# Patient Record
Sex: Female | Born: 1995 | Race: White | Hispanic: No | Marital: Married | Smoking: Never smoker
Health system: Southern US, Community
[De-identification: ages and names within clinical notes are randomized; demographics above are authoritative.]

## PROBLEM LIST (undated history)

## (undated) DIAGNOSIS — O139 Gestational [pregnancy-induced] hypertension without significant proteinuria, unspecified trimester: Secondary | ICD-10-CM

## (undated) DIAGNOSIS — Z8489 Family history of other specified conditions: Secondary | ICD-10-CM

## (undated) DIAGNOSIS — T8859XA Other complications of anesthesia, initial encounter: Secondary | ICD-10-CM

## (undated) DIAGNOSIS — T4145XA Adverse effect of unspecified anesthetic, initial encounter: Secondary | ICD-10-CM

## (undated) DIAGNOSIS — J353 Hypertrophy of tonsils with hypertrophy of adenoids: Secondary | ICD-10-CM

## (undated) HISTORY — PX: NO PAST SURGERIES: SHX2092

---
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
## 2013-01-12 ENCOUNTER — Telehealth: Payer: Self-pay | Admitting: Family Medicine

## 2013-01-12 NOTE — Telephone Encounter (Signed)
Discussed with patient. Offered patient office visit for evaluation but patient declined.

## 2013-01-12 NOTE — Telephone Encounter (Signed)
Patient seen 11/12/12 for illness

## 2013-01-12 NOTE — Telephone Encounter (Signed)
Patient has been having a headache, sore throat, sinus drainage, no facial pressure. She said since she has been in so many times this year for the same symptoms, she just wanted to see if we could call something in. I advised to her she may need an appointment.  [name]

## 2013-01-12 NOTE — Telephone Encounter (Signed)
1st- what she describes is a virus, give 7 days to run course, if gets worse ( svere facial pain or fever) then follow up , if virus goes beyond 7 to 10 days then more likely turning into a sinus infection and OV needed then

## 2013-01-13 ENCOUNTER — Ambulatory Visit: Payer: No Typology Code available for payment source | Admitting: Family Medicine

## 2013-03-20 ENCOUNTER — Ambulatory Visit (INDEPENDENT_AMBULATORY_CARE_PROVIDER_SITE_OTHER): Payer: BC Managed Care – PPO | Admitting: Adult Health

## 2013-03-20 ENCOUNTER — Encounter: Payer: Self-pay | Admitting: Adult Health

## 2013-03-20 ENCOUNTER — Encounter (INDEPENDENT_AMBULATORY_CARE_PROVIDER_SITE_OTHER): Payer: Self-pay

## 2013-03-20 VITALS — BP 108/80 | Ht 61.0 in | Wt 168.0 lb

## 2013-03-20 DIAGNOSIS — Z3202 Encounter for pregnancy test, result negative: Secondary | ICD-10-CM

## 2013-03-20 DIAGNOSIS — Z3049 Encounter for surveillance of other contraceptives: Secondary | ICD-10-CM

## 2013-03-20 DIAGNOSIS — Z3009 Encounter for other general counseling and advice on contraception: Secondary | ICD-10-CM | POA: Insufficient documentation

## 2013-03-20 LAB — POCT URINE PREGNANCY: Preg Test, Ur: NEGATIVE

## 2013-03-20 NOTE — Patient Instructions (Signed)
Etonogestrel implant What is this medicine? ETONOGESTREL (et oh noe JES trel) is a contraceptive (birth control) device. It is used to prevent pregnancy. It can be used for up to 3 years. This medicine may be used for other purposes; ask your health care provider or pharmacist if you have questions. COMMON BRAND NAME(S): Implanon, Nexplanon  What should I tell my health care provider before I take this medicine? They need to know if you have any of these conditions: -abnormal vaginal bleeding -blood vessel disease or blood clots -cancer of the breast, cervix, or liver -depression -diabetes -gallbladder disease -headaches -heart disease or recent heart attack -high blood pressure -high cholesterol -kidney disease -liver disease -renal disease -seizures -tobacco smoker -an unusual or allergic reaction to etonogestrel, other hormones, anesthetics or antiseptics, medicines, foods, dyes, or preservatives -pregnant or trying to get pregnant -breast-feeding How should I use this medicine? This device is inserted just under the skin on the inner side of your upper arm by a health care professional. Talk to your pediatrician regarding the use of this medicine in children. Special care may be needed. Overdosage: If you think you've taken too much of this medicine contact a poison control center or emergency room at once. Overdosage: If you think you have taken too much of this medicine contact a poison control center or emergency room at once. NOTE: This medicine is only for you. Do not share this medicine with others. What if I miss a dose? This does not apply. What may interact with this medicine? Do not take this medicine with any of the following medications: -amprenavir -bosentan -fosamprenavir This medicine may also interact with the following medications: -barbiturate medicines for inducing sleep or treating seizures -certain medicines for fungal infections like ketoconazole and  itraconazole -griseofulvin -medicines to treat seizures like carbamazepine, felbamate, oxcarbazepine, phenytoin, topiramate -modafinil -phenylbutazone -rifampin -some medicines to treat HIV infection like atazanavir, indinavir, lopinavir, nelfinavir, tipranavir, ritonavir -St. John's wort This list may not describe all possible interactions. Give your health care provider a list of all the medicines, herbs, non-prescription drugs, or dietary supplements you use. Also tell them if you smoke, drink alcohol, or use illegal drugs. Some items may interact with your medicine. What should I watch for while using this medicine? This product does not protect you against HIV infection (AIDS) or other sexually transmitted diseases. You should be able to feel the implant by pressing your fingertips over the skin where it was inserted. Tell your doctor if you cannot feel the implant. What side effects may I notice from receiving this medicine? Side effects that you should report to your doctor or health care professional as soon as possible: -allergic reactions like skin rash, itching or hives, swelling of the face, lips, or tongue -breast lumps -changes in vision -confusion, trouble speaking or understanding -dark urine -depressed mood -general ill feeling or flu-like symptoms -light-colored stools -loss of appetite, nausea -right upper belly pain -severe headaches -severe pain, swelling, or tenderness in the abdomen -shortness of breath, chest pain, swelling in a leg -signs of pregnancy -sudden numbness or weakness of the face, arm or leg -trouble walking, dizziness, loss of balance or coordination -unusual vaginal bleeding, discharge -unusually weak or tired -yellowing of the eyes or skin Side effects that usually do not require medical attention (Report these to your doctor or health care professional if they continue or are bothersome.): -acne -breast pain -changes in  weight -cough -fever or chills -headache -irregular menstrual bleeding -itching, burning,   and vaginal discharge -pain or difficulty passing urine -sore throat This list may not describe all possible side effects. Call your doctor for medical advice about side effects. You may report side effects to FDA at 1-800-FDA-1088. Where should I keep my medicine? This drug is given in a hospital or clinic and will not be stored at home. NOTE: This sheet is a summary. It may not cover all possible information. If you have questions about this medicine, talk to your doctor, pharmacist, or health care provider.  2014, Elsevier/Gold Standard. (2011-08-13 15:37:45) NO SEX Call with menses

## 2013-03-20 NOTE — Progress Notes (Signed)
Subjective:     Patient ID: Karen Suarez, female   DOB: May 12, 1995, 18 y.o.   MRN: [id number]  HPI Karen MastersCandace is a 18 year old white female who stopped taking her OCs in November, was forgetting to take and was having random bleeding, thinks she would like to try nexplanon.  Review of Systems See HPI Reviewed past medical,surgical, social and family history. Reviewed medications and allergies.     Objective:   Physical Exam BP 108/80  Ht 5\' 1"  (1.549 m)  Wt 168 lb (76.204 kg)  BMI 31.76 kg/m2  LMP 02/17/2013   UPT negative, last sex about 1 month ago. Discussed nexplanon with pt  And she wants to try it.  Assessment:     Contraceptive counselling    Plan:    Check insurance for nexplanon coverage Review handout on nexplanon No sex  Call with menses

## 2013-04-06 ENCOUNTER — Ambulatory Visit: Payer: BC Managed Care – PPO | Admitting: Nurse Practitioner

## 2013-04-13 ENCOUNTER — Ambulatory Visit (INDEPENDENT_AMBULATORY_CARE_PROVIDER_SITE_OTHER): Payer: BC Managed Care – PPO | Admitting: Women's Health

## 2013-04-13 ENCOUNTER — Other Ambulatory Visit: Payer: BC Managed Care – PPO

## 2013-04-13 ENCOUNTER — Encounter: Payer: Self-pay | Admitting: Women's Health

## 2013-04-13 VITALS — BP 112/82 | Ht 61.0 in | Wt 168.0 lb

## 2013-04-13 DIAGNOSIS — Z3202 Encounter for pregnancy test, result negative: Secondary | ICD-10-CM

## 2013-04-13 DIAGNOSIS — Z30017 Encounter for initial prescription of implantable subdermal contraceptive: Secondary | ICD-10-CM

## 2013-04-13 LAB — HCG, QUANTITATIVE, PREGNANCY: hCG, Beta Chain, Quant, S: <1 m[IU]/mL

## 2013-04-13 NOTE — Progress Notes (Signed)
Patient ID: Karen Suarez, female   DOB: [birth year], 18 y.o.   MRN: [id number] Karen Suarez is a 18 y.o. year old Caucasian G0P0 female here for Nexplanon insertion.  Patient's last menstrual period was 02/20/2013., last sexual intercourse was 1 month ago, and her pregnancy test today was negative.  Risks/benefits/side effects of Nexplanon have been discussed and her questions have been answered.  Specifically, a failure rate of 02/998 has been reported, with an increased failure rate if pt takes St. John's Wort and/or antiseizure medicaitons.  Karen Suarez is aware of the common side effect of irregular bleeding, which the incidence of decreases over time.  BP 112/82  Ht 5\' 1"  (1.549 m)  Wt 168 lb (76.204 kg)  BMI 31.76 kg/m2  LMP 02/20/2013  Results for orders placed in visit on 04/13/13 (from the past 24 hour(s))  HCG, QUANTITATIVE, PREGNANCY   Collection Time    04/13/13  9:25 AM      Result Value Ref Range   hCG, Beta Chain, Quant, S <1.0       She is right-handed, so her left arm, approximately 4 inches proximal from the elbow, was cleansed with alcohol and anesthetized with 2cc of 1% Lidocaine.  The area was cleansed again with betadine and the Nexplanon was inserted per manufacturer's recommendations without difficulty.  A steri-strip and pressure bandage were applied.  Pt was instructed to keep the area clean and dry, remove pressure bandage in 24 hours, and keep insertion site covered with the steri-strip for 3-5 days.  Back up contraception was recommended for 2 weeks.  She was given a card indicating date Nexplanon was inserted and date it needs to be removed. Follow-up PRN problems.  Marge DuncansBooker, Kimberly Randall CNM, [facility] CareWHNP-BC 04/13/2013 3:57 PM

## 2013-04-13 NOTE — Patient Instructions (Signed)
Keep the area clean and dry.  You can remove the big bandage in 24 hours, and the small steri-strip bandage in 3-5 days.  A back up method, such as condoms, should be used for two weeks. You may have irregular vaginal bleeding for the first 6 months after the Nexplanon is placed, then the bleeding usually lightens and it is possible that you may not have any periods.  If you have any concerns, please give us a call.    Etonogestrel implant-Nexplanon What is this medicine? ETONOGESTREL (et oh noe JES trel) is a contraceptive (birth control) device. It is used to prevent pregnancy. It can be used for up to 3 years. This medicine may be used for other purposes; ask your health care provider or pharmacist if you have questions. COMMON BRAND NAME(S): Implanon, Nexplanon  What should I tell my health care provider before I take this medicine? They need to know if you have any of these conditions: -abnormal vaginal bleeding -blood vessel disease or blood clots -cancer of the breast, cervix, or liver -depression -diabetes -gallbladder disease -headaches -heart disease or recent heart attack -high blood pressure -high cholesterol -kidney disease -liver disease -renal disease -seizures -tobacco smoker -an unusual or allergic reaction to etonogestrel, other hormones, anesthetics or antiseptics, medicines, foods, dyes, or preservatives -pregnant or trying to get pregnant -breast-feeding How should I use this medicine? This device is inserted just under the skin on the inner side of your upper arm by a health care professional. Talk to your pediatrician regarding the use of this medicine in children. Special care may be needed. Overdosage: If you think you've taken too much of this medicine contact a poison control center or emergency room at once. Overdosage: If you think you have taken too much of this medicine contact a poison control center or emergency room at once. NOTE: This medicine is  only for you. Do not share this medicine with others. What if I miss a dose? This does not apply. What may interact with this medicine? Do not take this medicine with any of the following medications: -amprenavir -bosentan -fosamprenavir This medicine may also interact with the following medications: -barbiturate medicines for inducing sleep or treating seizures -certain medicines for fungal infections like ketoconazole and itraconazole -griseofulvin -medicines to treat seizures like carbamazepine, felbamate, oxcarbazepine, phenytoin, topiramate -modafinil -phenylbutazone -rifampin -some medicines to treat HIV infection like atazanavir, indinavir, lopinavir, nelfinavir, tipranavir, ritonavir -St. John's wort This list may not describe all possible interactions. Give your health care provider a list of all the medicines, herbs, non-prescription drugs, or dietary supplements you use. Also tell them if you smoke, drink alcohol, or use illegal drugs. Some items may interact with your medicine. What should I watch for while using this medicine? This product does not protect you against HIV infection (AIDS) or other sexually transmitted diseases. You should be able to feel the implant by pressing your fingertips over the skin where it was inserted. Tell your doctor if you cannot feel the implant. What side effects may I notice from receiving this medicine? Side effects that you should report to your doctor or health care professional as soon as possible: -allergic reactions like skin rash, itching or hives, swelling of the face, lips, or tongue -breast lumps -changes in vision -confusion, trouble speaking or understanding -dark urine -depressed mood -general ill feeling or flu-like symptoms -light-colored stools -loss of appetite, nausea -right upper belly pain -severe headaches -severe pain, swelling, or tenderness in the abdomen -shortness   of breath, chest pain, swelling in a  leg -signs of pregnancy -sudden numbness or weakness of the face, arm or leg -trouble walking, dizziness, loss of balance or coordination -unusual vaginal bleeding, discharge -unusually weak or tired -yellowing of the eyes or skin Side effects that usually do not require medical attention (Report these to your doctor or health care professional if they continue or are bothersome.): -acne -breast pain -changes in weight -cough -fever or chills -headache -irregular menstrual bleeding -itching, burning, and vaginal discharge -pain or difficulty passing urine -sore throat This list may not describe all possible side effects. Call your doctor for medical advice about side effects. You may report side effects to FDA at 1-800-FDA-1088. Where should I keep my medicine? This drug is given in a hospital or clinic and will not be stored at home. NOTE: This sheet is a summary. It may not cover all possible information. If you have questions about this medicine, talk to your doctor, pharmacist, or health care provider.  2014, Elsevier/Gold Standard. (2011-08-13 15:37:45)  

## 2013-04-24 ENCOUNTER — Telehealth: Payer: Self-pay | Admitting: Family Medicine

## 2013-04-24 NOTE — Telephone Encounter (Signed)
Our policy is not to call in antibiotics unless someone has been seen recently for that problem. Does she need something for symptoms? Also, if not better into next week, please call for appt.

## 2013-04-24 NOTE — Telephone Encounter (Signed)
Patients mother and stepfather were seen last week for sinusitis ([place] and [place]). Patient is now having the same cough, congestion, and sinus issues and mom is hoping we can call something in for her.  Karen Suarez

## 2013-04-24 NOTE — Telephone Encounter (Signed)
Spoke with Karen Suarez, she verbalized understanding. She does not need anything else called in. I explained that we couldn't call in antibiotics w/o seeing the pt.

## 2013-08-19 ENCOUNTER — Encounter: Payer: Self-pay | Admitting: Nurse Practitioner

## 2013-08-19 ENCOUNTER — Ambulatory Visit (INDEPENDENT_AMBULATORY_CARE_PROVIDER_SITE_OTHER): Payer: BC Managed Care – PPO | Admitting: Nurse Practitioner

## 2013-08-19 VITALS — BP 104/68 | Temp 98.8°F | Ht 61.0 in | Wt 170.0 lb

## 2013-08-19 DIAGNOSIS — K2970 Gastritis, unspecified, without bleeding: Secondary | ICD-10-CM

## 2013-08-19 DIAGNOSIS — K2990 Gastroduodenitis, unspecified, without bleeding: Secondary | ICD-10-CM

## 2013-08-19 DIAGNOSIS — J329 Chronic sinusitis, unspecified: Secondary | ICD-10-CM

## 2013-08-19 DIAGNOSIS — K219 Gastro-esophageal reflux disease without esophagitis: Secondary | ICD-10-CM

## 2013-08-19 MED ORDER — OMEPRAZOLE 20 MG PO CPDR: 20.0000 mg | DELAYED_RELEASE_CAPSULE | Freq: Every day | ORAL | Status: DC

## 2013-08-19 MED ORDER — AMOXICILLIN-POT CLAVULANATE 875-125 MG PO TABS: 1.0000 | ORAL_TABLET | Freq: Two times a day (BID) | ORAL | Status: DC

## 2013-08-19 NOTE — Patient Instructions (Signed)
Nasacort AQ as directed Claritin or Allegra as directed  Gastroesophageal Reflux Disease, Adult Gastroesophageal reflux disease (GERD) happens when acid from your stomach flows up into the esophagus. When acid comes in contact with the esophagus, the acid causes soreness (inflammation) in the esophagus. Over time, GERD may create small holes (ulcers) in the lining of the esophagus. CAUSES   Increased body weight. This puts pressure on the stomach, making acid rise from the stomach into the esophagus.  Smoking. This increases acid production in the stomach.  Drinking alcohol. This causes decreased pressure in the lower esophageal sphincter (valve or ring of muscle between the esophagus and stomach), allowing acid from the stomach into the esophagus.  Late evening meals and a full stomach. This increases pressure and acid production in the stomach.  A malformed lower esophageal sphincter. Sometimes, no cause is found. SYMPTOMS   Burning pain in the lower part of the mid-chest behind the breastbone and in the mid-stomach area. This may occur twice a week or more often.  Trouble swallowing.  Sore throat.  Dry cough.  Asthma-like symptoms including chest tightness, shortness of breath, or wheezing. DIAGNOSIS  Your caregiver may be able to diagnose GERD based on your symptoms. In some cases, X-rays and other tests may be done to check for complications or to check the condition of your stomach and esophagus. TREATMENT  Your caregiver may recommend over-the-counter or prescription medicines to help decrease acid production. Ask your caregiver before starting or adding any new medicines.  HOME CARE INSTRUCTIONS   Change the factors that you can control. Ask your caregiver for guidance concerning weight loss, quitting smoking, and alcohol consumption.  Avoid foods and drinks that make your symptoms worse, such as:  Caffeine or alcoholic drinks.  Chocolate.  Peppermint or mint  flavorings.  Garlic and onions.  Spicy foods.  Citrus fruits, such as oranges, lemons, or limes.  Tomato-based foods such as sauce, chili, salsa, and pizza.  Fried and fatty foods.  Avoid lying down for the 3 hours prior to your bedtime or prior to taking a nap.  Eat small, frequent meals instead of large meals.  Wear loose-fitting clothing. Do not wear anything tight around your waist that causes pressure on your stomach.  Raise the head of your bed 6 to 8 inches with wood blocks to help you sleep. Extra pillows will not help.  Only take over-the-counter or prescription medicines for pain, discomfort, or fever as directed by your caregiver.  Do not take aspirin, ibuprofen, or other nonsteroidal anti-inflammatory drugs (NSAIDs). SEEK IMMEDIATE MEDICAL CARE IF:   You have pain in your arms, neck, jaw, teeth, or back.  Your pain increases or changes in intensity or duration.  You develop nausea, vomiting, or sweating (diaphoresis).  You develop shortness of breath, or you faint.  Your vomit is green, yellow, black, or looks like coffee grounds or blood.  Your stool is red, bloody, or black. These symptoms could be signs of other problems, such as heart disease, gastric bleeding, or esophageal bleeding. MAKE SURE YOU:   Understand these instructions.  Will watch your condition.  Will get help right away if you are not doing well or get worse. Document Released: 11/15/2004 Document Revised: 04/30/2011 Document Reviewed: 08/25/2010 [facility] Patient Information 2015 [place]. This information is not intended to replace advice given to you by your health care provider. Make sure you discuss any questions you have with your health care provider.

## 2013-08-20 ENCOUNTER — Encounter: Payer: Self-pay | Admitting: Family Medicine

## 2013-08-23 ENCOUNTER — Encounter: Payer: Self-pay | Admitting: Nurse Practitioner

## 2013-08-23 DIAGNOSIS — K219 Gastro-esophageal reflux disease without esophagitis: Secondary | ICD-10-CM | POA: Insufficient documentation

## 2013-08-23 DIAGNOSIS — K2970 Gastritis, unspecified, without bleeding: Secondary | ICD-10-CM | POA: Insufficient documentation

## 2013-08-23 NOTE — Progress Notes (Signed)
Subjective:  Presents complaints of daily sore throat over the past 2-3 months. No fever. 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 No wheezing. Headache only with prolonged cough. No ear pain. No choking. Off-and-on sensation of something in her throat. Has had burning in her throat for the past 24 hours. Has epigastric pain with acid reflux and heartburn at least 2-3 days out of the week. Drinks a large amount of caffeine. Nonsmoker. Denies alcohol or excessive NSAID use. Describes stress level as high. Some intake of acidic foods such as tomatoes. No constipation diarrhea or blood in her stools. Nausea but no vomiting. FMH: Her mother has GERD.  Objective:   BP 104/68  Temp(Src) 98.8 F (37.1 C) (Oral)  Ht 5\' 1"  (1.549 m)  Wt 170 lb (77.111 kg)  BMI 32.14 kg/m2 NAD. Alert, oriented. TMs normal limit. Pharynx injected with green PND noted. Neck supple with mild soft anterior adenopathy. Lungs clear. Heart regular rate rhythm. Thyroid no masses or tenderness noted. Abdomen soft nondistended with distinct epigastric area tenderness, otherwise benign.  Assessment: Rhinosinusitis  Gastritis  Gastroesophageal reflux disease without esophagitis  Plan:  Meds ordered this encounter  Medications  . amoxicillin-clavulanate (AUGMENTIN) 875-125 MG per tablet    Sig: Take 1 tablet by mouth 2 (two) times daily.    Dispense:  20 tablet    Refill:  0    Order Specific Question:  Supervising Provider    Answer:  Merlyn AlbertLUKING, WILLIAM S [[id number]]  . omeprazole (PRILOSEC) 20 MG capsule    Sig: Take 1 capsule (20 mg total) by mouth daily. Prn acid reflux    Dispense:  30 capsule    Refill:  5    Order Specific Question:  Supervising Provider    Answer:  Merlyn AlbertLUKING, WILLIAM S [[id number]]   Nasacort AQ and OTC antihistamine as directed. Given written and verbal information on reflux disease. Slowly cut back on  caffeine use. Discussed importance of stress reduction. Also feel that environmental irritants are contributing to her head congestion. Warning signs reviewed. Call back in 2-3 weeks if symptoms have not resolved, call or go to ED sooner if worse.

## 2013-12-31 ENCOUNTER — Encounter: Payer: Self-pay | Admitting: Family Medicine

## 2013-12-31 ENCOUNTER — Ambulatory Visit (INDEPENDENT_AMBULATORY_CARE_PROVIDER_SITE_OTHER): Payer: BC Managed Care – PPO | Admitting: Family Medicine

## 2013-12-31 VITALS — BP 128/82 | Temp 98.3°F | Ht 61.0 in | Wt 181.2 lb

## 2013-12-31 DIAGNOSIS — J329 Chronic sinusitis, unspecified: Secondary | ICD-10-CM

## 2013-12-31 MED ORDER — PANTOPRAZOLE SODIUM 40 MG PO TBEC: 40.0000 mg | DELAYED_RELEASE_TABLET | Freq: Every day | ORAL | Status: DC

## 2013-12-31 MED ORDER — CEFDINIR 300 MG PO CAPS: 300.0000 mg | ORAL_CAPSULE | Freq: Two times a day (BID) | ORAL | Status: DC

## 2013-12-31 NOTE — Progress Notes (Signed)
   Subjective:    Patient ID: Karen Suarez, female    DOB: [birth year], 18 y.o.   MRN: [id number]  Sinusitis This is a new problem. The current episode started in the past 7 days. Associated symptoms include congestion.    Sunday had runny nose and headac  Pressure and headache  Gunky ha,  Gets minimal allergies   No caffeince  Strong fam hx of reflux  Takes omep qm  Works at [employer]  Review of Systems  HENT: Positive for congestion.    No vomiting no diarrhea no rash    Objective:   Physical Exam Alert active good hydration. Moderate malaise. HEENT moderate nasal congestion frontal maxillary tenderness. Pharynx slight erythema. Lungs clear. Heart regular in rhythm.       Assessment & Plan:  Impression acute rhinosinusitis post viral plan antibiotics prescribed. Symptomatic care discussed. WSL

## 2014-02-10 ENCOUNTER — Encounter: Payer: Self-pay | Admitting: Family Medicine

## 2014-02-10 ENCOUNTER — Ambulatory Visit (INDEPENDENT_AMBULATORY_CARE_PROVIDER_SITE_OTHER): Payer: BC Managed Care – PPO | Admitting: Family Medicine

## 2014-02-10 VITALS — Temp 98.5°F | Ht 61.0 in | Wt 180.2 lb

## 2014-02-10 DIAGNOSIS — J0100 Acute maxillary sinusitis, unspecified: Secondary | ICD-10-CM

## 2014-02-10 MED ORDER — LEVOFLOXACIN 500 MG PO TABS: 500.0000 mg | ORAL_TABLET | Freq: Every day | ORAL | Status: DC

## 2014-02-10 NOTE — Progress Notes (Signed)
   Subjective:    Patient ID: Karen Suarez, female    DOB: 1996-02-10, 18 y.o.   MRN: [id number]  Sinusitis This is a new problem. The current episode started 1 to 4 weeks ago. Associated symptoms include congestion, coughing and headaches. Pertinent negatives include no ear pain or shortness of breath. Treatments tried: claritin d.   PMH benign   Review of Systems  Constitutional: Negative for fever and activity change.  HENT: Positive for congestion and rhinorrhea. Negative for ear pain.   Eyes: Negative for discharge.  Respiratory: Positive for cough. Negative for shortness of breath and wheezing.   Cardiovascular: Negative for chest pain.  Neurological: Positive for headaches.       Objective:   Physical Exam  Constitutional: She appears well-developed.  HENT:  Head: Normocephalic.  Nose: Nose normal.  Mouth/Throat: Oropharynx is clear and moist. No oropharyngeal exudate.  Neck: Neck supple.  Cardiovascular: Normal rate and normal heart sounds.   No murmur heard. Pulmonary/Chest: Effort normal and breath sounds normal. She has no wheezes.  Lymphadenopathy:    She has no cervical adenopathy.  Skin: Skin is warm and dry.  Nursing note and vitals reviewed.         Assessment & Plan:  Viral syndrome Secondary sinusitis Antibiotics prescribed

## 2014-07-26 ENCOUNTER — Encounter: Payer: Self-pay | Admitting: Family Medicine

## 2014-07-26 ENCOUNTER — Ambulatory Visit (INDEPENDENT_AMBULATORY_CARE_PROVIDER_SITE_OTHER): Payer: BLUE CROSS/BLUE SHIELD | Admitting: Family Medicine

## 2014-07-26 VITALS — BP 128/82 | Temp 98.5°F | Ht 61.0 in | Wt 182.2 lb

## 2014-07-26 DIAGNOSIS — J019 Acute sinusitis, unspecified: Secondary | ICD-10-CM | POA: Diagnosis not present

## 2014-07-26 DIAGNOSIS — J309 Allergic rhinitis, unspecified: Secondary | ICD-10-CM

## 2014-07-26 MED ORDER — AZELASTINE HCL 0.1 % NA SOLN: 2.0000 | Freq: Two times a day (BID) | NASAL | Status: DC

## 2014-07-26 MED ORDER — LORATADINE 10 MG PO TABS: 10.0000 mg | ORAL_TABLET | Freq: Every day | ORAL | Status: DC

## 2014-07-26 MED ORDER — AMOXICILLIN-POT CLAVULANATE 875-125 MG PO TABS: 1.0000 | ORAL_TABLET | Freq: Two times a day (BID) | ORAL | Status: AC

## 2014-07-26 NOTE — Progress Notes (Signed)
   Subjective:    Patient ID: Karen Suarez, female    DOB: 03-23-1995, 19 y.o.   MRN: [id number]  Sinus Problem This is a new problem. The current episode started more than 1 month ago. Associated symptoms include congestion, coughing and a sore throat.   Works around a bunch of hair products  Now wondering if weight has something to do  Constantly has to clear the throat   Only during the work week   For over a yr  Non smoker  No sig allergy hx  Pt constantly clearin g throat.  No sig otc meds     Review of Systems  HENT: Positive for congestion and sore throat.   Respiratory: Positive for cough.        Objective:   Physical Exam  Alert mild malaise moderate nasal congestion pharynx slight erythema neck supple. Lungs clear. Heart regular in rhythm.      Assessment & Plan:  Impression 1 acute rhinosinusitis #2 chronic allergic rhinitis secondary to environmental exposure at work plan patient notes sterilely nasal spray did not help. Patient also states Augmentin helped, but she is convinced most of her difficulty is chronic irritant exposure at work. Given Augmentin and Astelin. Encouraged to take loratadine daily

## 2014-07-29 ENCOUNTER — Ambulatory Visit (INDEPENDENT_AMBULATORY_CARE_PROVIDER_SITE_OTHER): Payer: BLUE CROSS/BLUE SHIELD | Admitting: Adult Health

## 2014-07-29 ENCOUNTER — Encounter: Payer: Self-pay | Admitting: Adult Health

## 2014-07-29 VITALS — BP 102/70 | HR 72 | Ht 61.0 in | Wt 185.0 lb

## 2014-07-29 DIAGNOSIS — Z975 Presence of (intrauterine) contraceptive device: Secondary | ICD-10-CM | POA: Diagnosis not present

## 2014-07-29 DIAGNOSIS — R635 Abnormal weight gain: Secondary | ICD-10-CM

## 2014-07-29 DIAGNOSIS — N912 Amenorrhea, unspecified: Secondary | ICD-10-CM | POA: Diagnosis not present

## 2014-07-29 NOTE — Progress Notes (Signed)
Subjective:     Patient ID: Karen Suarez, female   DOB: 18-Nov-1995, 19 y.o.   MRN: [id number]  HPI Karen Suarez is a 19 year old white female in complaining of weight gain on nexplanon and no period.Has had nexplanon since February 2015 and has gained about 17 lbs. She got the nexplanon to control her periods, did not remember to take pills.  Review of Systems  Patient denies any headaches, hearing loss, fatigue, blurred vision, shortness of breath, chest pain, abdominal pain, problems with bowel movements, urination, or intercourse(not having sex). No joint pain or mood swings.See HPI for positives.  Reviewed past medical,surgical, social and family history. Reviewed medications and allergies.     Objective:   Physical Exam BP 102/70 mmHg  Pulse 72  Ht 5\' 1"  (1.549 m)  Wt 185 lb (83.915 kg)  BMI 34.97 kg/m2 Skin warm and dry. Lungs: clear to ausculation bilaterally. Cardiovascular: regular rate and rhythm.    Discussed it is not unusual to not have period with nexplanon and try to lose weight, but if she wants it out, IUD is option where you don't have to think about it.But also discussed OCs, Depo, IUD and nuva ring.Will keep nexplanon for now.  Assessment:     Weight gain  Nexplanon in place Amenorrhea     Plan:    Return 9/26 for pap and physical Review handout on liletta and weight loss Work on losing weight

## 2014-07-29 NOTE — Patient Instructions (Signed)
Work on [employer] loss thru diet Return 9/26 for pap and physical Calorie Counting for Weight Loss Calories are energy you get from the things you eat and drink. Your body uses this energy to keep you going throughout the day. The number of calories you eat affects your weight. When you eat more calories than your body needs, your body stores the extra calories as fat. When you eat fewer calories than your body needs, your body burns fat to get the energy it needs. Calorie counting means keeping track of how many calories you eat and drink each day. If you make sure to eat fewer calories than your body needs, you should lose weight. In order for calorie counting to work, you will need to eat the number of calories that are right for you in a day to lose a healthy amount of weight per week. A healthy amount of weight to lose per week is usually 1-2 lb (0.5-0.9 kg). A dietitian can determine how many calories you need in a day and give you suggestions on how to reach your calorie goal.  WHAT IS MY MY PLAN? My goal is to have ________1500__ calories per day.  If I have this many calories per day, I should lose around ___1-2_______ pounds per week. WHAT DO I NEED TO KNOW ABOUT CALORIE COUNTING? In order to meet your daily calorie goal, you will need to:  Find out how many calories are in each food you would like to eat. Try to do this before you eat.  Decide how much of the food you can eat.  Write down what you ate and how many calories it had. Doing this is called keeping a food log. WHERE DO I FIND CALORIE INFORMATION? The number of calories in a food can be found on a Nutrition Facts label. Note that all the information on a label is based on a specific serving of the food. If a food does not have a Nutrition Facts label, try to look up the calories online or ask your dietitian for help. HOW DO I DECIDE HOW MUCH TO EAT? To decide how much of the food you can eat, you will need to consider both the  number of calories in one serving and the size of one serving. This information can be found on the Nutrition Facts label. If a food does not have a Nutrition Facts label, look up the information online or ask your dietitian for help. Remember that calories are listed per serving. If you choose to have more than one serving of a food, you will have to multiply the calories per serving by the amount of servings you plan to eat. For example, the label on a package of bread might say that a serving size is 1 slice and that there are 90 calories in a serving. If you eat 1 slice, you will have eaten 90 calories. If you eat 2 slices, you will have eaten 180 calories. HOW DO I KEEP A FOOD LOG? After each meal, record the following information in your food log:  What you ate.  How much of it you ate.  How many calories it had.  Then, add up your calories. Keep your food log near you, such as in a small notebook in your pocket. Another option is to use a mobile app or website. Some programs will calculate calories for you and show you how many calories you have left each time you add an item to the log.  WHAT ARE SOME CALORIE COUNTING TIPS?  Use your calories on foods and drinks that will fill you up and not leave you hungry. Some examples of this include foods like nuts and nut butters, vegetables, lean proteins, and high-fiber foods (more than 5 g fiber per serving).  Eat nutritious foods and avoid empty calories. Empty calories are calories you get from foods or beverages that do not have many nutrients, such as candy and soda. It is better to have a nutritious high-calorie food (such as an avocado) than a food with few nutrients (such as a bag of chips).  Know how many calories are in the foods you eat most often. This way, you do not have to look up how many calories they have each time you eat them.  Look out for foods that may seem like low-calorie foods but are really high-calorie foods, such as  baked goods, soda, and fat-free candy.  Pay attention to calories in drinks. Drinks such as sodas, specialty coffee drinks, alcohol, and juices have a lot of calories yet do not fill you up. Choose low-calorie drinks like water and diet drinks.  Focus your calorie counting efforts on higher calorie items. Logging the calories in a garden salad that contains only vegetables is less important than calculating the calories in a milk shake.  Find a way of tracking calories that works for you. Get creative. Most people who are successful find ways to keep track of how much they eat in a day, even if they do not count every calorie. WHAT ARE SOME PORTION CONTROL TIPS?  Know how many calories are in a serving. This will help you know how many servings of a certain food you can have.  Use a measuring cup to measure serving sizes. This is helpful when you start out. With time, you will be able to estimate serving sizes for some foods.  Take some time to put servings of different foods on your favorite plates, bowls, and cups so you know what a serving looks like.  Try not to eat straight from a bag or box. Doing this can lead to overeating. Put the amount you would like to eat in a cup or on a plate to make sure you are eating the right portion.  Use smaller plates, glasses, and bowls to prevent overeating. This is a quick and easy way to practice portion control. If your plate is smaller, less food can fit on it.  Try not to multitask while eating, such as watching TV or using your computer. If it is time to eat, sit down at a table and enjoy your food. Doing this will help you to start recognizing when you are full. It will also make you more aware of what and how much you are eating. HOW CAN I CALORIE COUNT WHEN EATING OUT?  Ask for smaller portion sizes or child-sized portions.  Consider sharing an entree and sides instead of getting your own entree.  If you get your own entree, eat only half.  Ask for a box at the beginning of your meal and put the rest of your entree in it so you are not tempted to eat it.  Look for the calories on the menu. If calories are listed, choose the lower calorie options.  Choose dishes that include vegetables, fruits, whole grains, low-fat dairy products, and lean protein. Focusing on smart food choices from each of the 5 food groups can help you stay on track at restaurants.  Choose items that are boiled, broiled, grilled, or steamed.  Choose water, milk, unsweetened iced tea, or other drinks without added sugars. If you want an alcoholic beverage, choose a lower calorie option. For example, a regular margarita can have up to 700 calories and a glass of wine has around 150.  Stay away from items that are buttered, battered, fried, or served with cream sauce. Items labeled "crispy" are usually fried, unless stated otherwise.  Ask for dressings, sauces, and syrups on the side. These are usually very high in calories, so do not eat much of them.  Watch out for salads. Many people think salads are a healthy option, but this is often not the case. Many salads come with bacon, fried chicken, lots of cheese, fried chips, and dressing. All of these items have a lot of calories. If you want a salad, choose a garden salad and ask for grilled meats or steak. Ask for the dressing on the side, or ask for olive oil and vinegar or lemon to use as dressing.  Estimate how many servings of a food you are given. For example, a serving of cooked rice is  cup or about the size of half a tennis ball or one cupcake wrapper. Knowing serving sizes will help you be aware of how much food you are eating at restaurants. The list below tells you how big or small some common portion sizes are based on everyday objects.  1 oz--4 stacked dice.  3 oz--1 deck of cards.  1 tsp--1 dice.  1 Tbsp-- a Ping-Pong ball.  2 Tbsp--1 Ping-Pong ball.   cup--1 tennis ball or 1 cupcake  wrapper.  1 cup--1 baseball. Document Released: 02/05/2005 Document Revised: 06/22/2013 Document Reviewed: 12/11/2012 [facility] Patient Information 2015 [place], [place]. This information is not intended to replace advice given to you by your health care provider. Make sure you discuss any questions you have with your health care provider.

## 2014-09-07 ENCOUNTER — Telehealth: Payer: Self-pay | Admitting: Adult Health

## 2014-09-07 NOTE — Telephone Encounter (Signed)
Spoke with pt. Pt states she wants her Nexplanon removed. Has been on period x 3 weeks. She is interested in starting pills. I advised she would need an appt. Call transferred to front desk for appt. JSY

## 2014-09-17 ENCOUNTER — Encounter: Payer: Self-pay | Admitting: Adult Health

## 2014-09-17 ENCOUNTER — Ambulatory Visit (INDEPENDENT_AMBULATORY_CARE_PROVIDER_SITE_OTHER): Payer: BLUE CROSS/BLUE SHIELD | Admitting: Adult Health

## 2014-09-17 VITALS — BP 118/60 | HR 88 | Ht 61.0 in | Wt 179.0 lb

## 2014-09-17 DIAGNOSIS — Z3202 Encounter for pregnancy test, result negative: Secondary | ICD-10-CM | POA: Diagnosis not present

## 2014-09-17 DIAGNOSIS — Z30011 Encounter for initial prescription of contraceptive pills: Secondary | ICD-10-CM

## 2014-09-17 DIAGNOSIS — Z3046 Encounter for surveillance of implantable subdermal contraceptive: Secondary | ICD-10-CM | POA: Insufficient documentation

## 2014-09-17 DIAGNOSIS — Z309 Encounter for contraceptive management, unspecified: Secondary | ICD-10-CM | POA: Insufficient documentation

## 2014-09-17 DIAGNOSIS — Z308 Encounter for other contraceptive management: Secondary | ICD-10-CM

## 2014-09-17 DIAGNOSIS — Z3200 Encounter for pregnancy test, result unknown: Secondary | ICD-10-CM

## 2014-09-17 LAB — POCT URINE PREGNANCY: PREG TEST UR: NEGATIVE

## 2014-09-17 MED ORDER — NORETHIN-ETH ESTRAD-FE BIPHAS 1 MG-10 MCG / 10 MCG PO TABS: 1.0000 | ORAL_TABLET | Freq: Every day | ORAL | Status: DC

## 2014-09-17 NOTE — Progress Notes (Signed)
Subjective:     Patient ID: Karen Suarez, female   DOB: September 10, 1995, 19 y.o.   MRN: [id number]  HPI Karen Suarez is a 19 year old white female in for nexplanon removal, she is moody and has irregular bleeding and wants it out.  Review of Systems Patient denies any headaches, hearing loss, fatigue, blurred vision, shortness of breath, chest pain, abdominal pain, problems with bowel movements, urination, or intercourse. No joint pain, see HPI for positives. Reviewed past medical,surgical, social and family history. Reviewed medications and allergies.      Objective:   Physical Exam BP 118/60 mmHg  Pulse 88  Ht  (1.549 m)  Wt 179 lb (81.194 kg)  BMI 33.84 kg/m2UPT negative, consent signed time out called, left arm cleansed with betadine, and injected with 1.5 cc 2% lidocaine and waited til numb.Under sterile technique a #11 blade was used to make small vertical incision, and a curved forceps was used to easily remove rod. Steri strips applied. Pressure dressing applied.She was a little hot and after resting was fine.    Assessment:     Nexplanon removal Contraceptive management    Plan:     Use condoms x 4 weeks, keep clean and dry x 24 hours, no heavy lifting, keep steri strips on x 72 hours, Keep pressure dressing on x 24 hours. Follow up prn problems.   Rx lo loestrin take 1 daily disp 1 pack with 11 refills, start today 1 pack given lot [id number] A exp 5/17 Return in September for pap as scheduled

## 2014-09-17 NOTE — Patient Instructions (Signed)
Take lo loestrin at same time daily, start today Use condoms Use condoms x 4 weeks, keep clean and dry x 24 hours, no heavy lifting, keep steri strips on x 72 hours, Keep pressure dressing on x 24 hours. Follow up prn problems. Return in September for pap

## 2014-10-13 ENCOUNTER — Telehealth: Payer: Self-pay | Admitting: Adult Health

## 2014-10-13 NOTE — Telephone Encounter (Signed)
Left message letting pt know [facility] pharmacy should have the prescription for pt's birth control. It was sent over in July with 11 refills. Advised to call [name] and if they don't have it, pt to call us back. JSY

## 2014-10-14 ENCOUNTER — Telehealth: Payer: Self-pay | Admitting: *Deleted

## 2014-10-14 NOTE — Telephone Encounter (Signed)
Left message x 1. JSY 

## 2014-10-15 NOTE — Telephone Encounter (Signed)
Lo Loestrin samples given to pt. 2 boxes. Lot # [id number] A exp 6/17. JSY

## 2014-11-15 ENCOUNTER — Ambulatory Visit (INDEPENDENT_AMBULATORY_CARE_PROVIDER_SITE_OTHER): Payer: BLUE CROSS/BLUE SHIELD | Admitting: Adult Health

## 2014-11-15 ENCOUNTER — Other Ambulatory Visit (HOSPITAL_COMMUNITY)
Admission: RE | Admit: 2014-11-15 | Discharge: 2014-11-15 | Disposition: A | Discharge status: Home / Self Care | Payer: BLUE CROSS/BLUE SHIELD | Source: Ambulatory Visit | Attending: Adult Health | Admitting: Adult Health

## 2014-11-15 ENCOUNTER — Encounter: Payer: Self-pay | Admitting: Adult Health

## 2014-11-15 VITALS — BP 122/88 | HR 84 | Ht 61.0 in | Wt 181.0 lb

## 2014-11-15 DIAGNOSIS — Z01419 Encounter for gynecological examination (general) (routine) without abnormal findings: Secondary | ICD-10-CM

## 2014-11-15 DIAGNOSIS — Z113 Encounter for screening for infections with a predominantly sexual mode of transmission: Secondary | ICD-10-CM | POA: Insufficient documentation

## 2014-11-15 DIAGNOSIS — Z3041 Encounter for surveillance of contraceptive pills: Secondary | ICD-10-CM

## 2014-11-15 NOTE — Progress Notes (Signed)
Patient ID: Karen Suarez, female   DOB: [birth year], 19 y.o.   MRN: [id number] History of Present Illness: Karen Suarez is a 19 year old white female in for a well woman gyn exam and pap.   Current Medications, Allergies, Past Medical History, Past Surgical History, Family History and Social History were reviewed in [employer] record.     Review of Systems: Patient denies any headaches, hearing loss, fatigue, blurred vision, shortness of breath, chest pain, abdominal pain, problems with bowel movements, urination, or intercourse(not currently having sex,last time was uncomfortable). No joint pain or mood swings.She is happy with her OCs.    Physical Exam:BP 122/88 mmHg  Pulse 84  Ht  (1.549 m)  Wt 181 lb (82.101 kg)  BMI 34.22 kg/m2  LMP 09/27/2014 (Approximate) General:  Well developed, well nourished, no acute distress Skin:  Warm and dry Neck:  Midline trachea, normal thyroid, good ROM, no lymphadenopathy Lungs; Clear to auscultation bilaterally Breast:  No dominant palpable mass, retraction, or nipple discharge Cardiovascular: Regular rate and rhythm Abdomen:  Soft, non tender, no hepatosplenomegaly Pelvic:  External genitalia is normal in appearance, no lesions.  The vagina is normal in appearance,has white discharge without odor. Urethra has no lesions or masses. The cervix is smooth, pap with GC/CHL performed.  Uterus is felt to be normal size, shape, and contour.  No adnexal masses or tenderness noted.Bladder is non tender, no masses felt. Extremities/musculoskeletal:  No swelling or varicosities noted, no clubbing or cyanosis Psych:  No mood changes, alert and cooperative,seems happy   Impression: Well woman gyn exam and pap Contraceptive maintenance     Plan: Continue lo loestrin, has refills Physical in 1 year, pap in 2 years if normal

## 2014-11-15 NOTE — Addendum Note (Signed)
Addended by: Colen Darling on: 11/15/2014 09:20 AM   Modules accepted: Orders

## 2014-11-15 NOTE — Patient Instructions (Signed)
Physical in 1 year Continue OCs  

## 2014-11-16 ENCOUNTER — Telehealth: Payer: Self-pay | Admitting: Adult Health

## 2014-11-16 LAB — CYTOLOGY - PAP

## 2014-11-16 MED ORDER — FLUCONAZOLE 150 MG PO TABS
ORAL_TABLET | ORAL | Status: DC
Start: 2014-11-16 — End: 2015-01-24

## 2014-11-16 NOTE — Telephone Encounter (Signed)
Left message pap was normal but has yeast, will rx diflucan

## 2014-12-01 ENCOUNTER — Telehealth: Payer: Self-pay | Admitting: Family Medicine

## 2014-12-01 DIAGNOSIS — J312 Chronic pharyngitis: Secondary | ICD-10-CM

## 2014-12-01 NOTE — Telephone Encounter (Signed)
You can also reach her at [phone]

## 2014-12-01 NOTE — Telephone Encounter (Signed)
Pt is needing a referral to an ent for recurrent sore throats.

## 2014-12-01 NOTE — Telephone Encounter (Signed)
Discussed with pt. Order for referral put in.  

## 2014-12-01 NOTE — Telephone Encounter (Signed)
May go ahead with referral.Dr.Teoh-or ENT of her choice

## 2014-12-03 ENCOUNTER — Telehealth: Payer: Self-pay | Admitting: Family Medicine

## 2014-12-03 NOTE — Telephone Encounter (Signed)
Pt states that she was wondering when her referral to the ENT was going  To happen? Advised it is in the system an we are working on them as  Fast as we can, that Karen Suarez will be calling her or the ENT when the process Has made it way through.   Wanted you to know she was checking on the an would like it sooner rather than later

## 2015-01-10 ENCOUNTER — Other Ambulatory Visit: Payer: Self-pay | Admitting: Otolaryngology

## 2015-01-20 DIAGNOSIS — J353 Hypertrophy of tonsils with hypertrophy of adenoids: Secondary | ICD-10-CM

## 2015-01-20 HISTORY — DX: Hypertrophy of tonsils with hypertrophy of adenoids: J35.3

## 2015-01-21 ENCOUNTER — Telehealth: Payer: Self-pay | Admitting: Adult Health

## 2015-01-21 MED ORDER — NORETHINDRONE ACET-ETHINYL EST 1-20 MG-MCG PO TABS: 1.0000 | ORAL_TABLET | Freq: Every day | ORAL | Status: DC

## 2015-01-21 NOTE — Telephone Encounter (Signed)
Spoke with pt. Pt is on Lo Loestrin but needs something cheaper, under $15.00. She would like something similar to Lo Loestrin. Please advise. Thanks!! JSY

## 2015-01-21 NOTE — Telephone Encounter (Signed)
Will rx junel 1-20 °

## 2015-01-24 ENCOUNTER — Encounter (HOSPITAL_BASED_OUTPATIENT_CLINIC_OR_DEPARTMENT_OTHER): Payer: Self-pay | Admitting: *Deleted

## 2015-01-31 ENCOUNTER — Encounter (HOSPITAL_BASED_OUTPATIENT_CLINIC_OR_DEPARTMENT_OTHER)
Admission: RE | Disposition: A | Discharge status: Home / Self Care | Payer: Self-pay | Source: Ambulatory Visit | Attending: Otolaryngology

## 2015-01-31 ENCOUNTER — Ambulatory Visit (HOSPITAL_BASED_OUTPATIENT_CLINIC_OR_DEPARTMENT_OTHER): Payer: BLUE CROSS/BLUE SHIELD | Admitting: Anesthesiology

## 2015-01-31 ENCOUNTER — Ambulatory Visit (HOSPITAL_BASED_OUTPATIENT_CLINIC_OR_DEPARTMENT_OTHER)
Admission: RE | Admit: 2015-01-31 | Discharge: 2015-01-31 | Disposition: A | Discharge status: Home / Self Care | Payer: BLUE CROSS/BLUE SHIELD | Source: Ambulatory Visit | Attending: Otolaryngology | Admitting: Otolaryngology

## 2015-01-31 ENCOUNTER — Encounter (HOSPITAL_BASED_OUTPATIENT_CLINIC_OR_DEPARTMENT_OTHER): Payer: Self-pay

## 2015-01-31 DIAGNOSIS — J3501 Chronic tonsillitis: Secondary | ICD-10-CM | POA: Insufficient documentation

## 2015-01-31 DIAGNOSIS — J353 Hypertrophy of tonsils with hypertrophy of adenoids: Secondary | ICD-10-CM | POA: Insufficient documentation

## 2015-01-31 DIAGNOSIS — R599 Enlarged lymph nodes, unspecified: Secondary | ICD-10-CM | POA: Insufficient documentation

## 2015-01-31 DIAGNOSIS — J312 Chronic pharyngitis: Secondary | ICD-10-CM | POA: Insufficient documentation

## 2015-01-31 HISTORY — PX: TONSILLECTOMY AND ADENOIDECTOMY: SHX28

## 2015-01-31 HISTORY — DX: Hypertrophy of tonsils with hypertrophy of adenoids: J35.3

## 2015-01-31 HISTORY — DX: Family history of other specified conditions: Z84.89

## 2015-01-31 SURGERY — TONSILLECTOMY AND ADENOIDECTOMY
Anesthesia: General | Site: Throat

## 2015-01-31 MED ORDER — HYDROMORPHONE HCL 1 MG/ML IJ SOLN
INTRAMUSCULAR | Status: AC
  Filled 2015-01-31: qty 1

## 2015-01-31 MED ORDER — PROMETHAZINE HCL 25 MG/ML IJ SOLN
6.2500 mg | INTRAMUSCULAR | Status: DC | PRN
  Administered 2015-01-31: 6.25 mg via INTRAVENOUS

## 2015-01-31 MED ORDER — SUCCINYLCHOLINE CHLORIDE 20 MG/ML IJ SOLN
INTRAMUSCULAR | Status: DC | PRN
  Administered 2015-01-31: 100 mg via INTRAVENOUS

## 2015-01-31 MED ORDER — MIDAZOLAM HCL 2 MG/2ML IJ SOLN
INTRAMUSCULAR | Status: AC
  Filled 2015-01-31: qty 2

## 2015-01-31 MED ORDER — LIDOCAINE HCL (CARDIAC) 20 MG/ML IV SOLN
INTRAVENOUS | Status: AC
  Filled 2015-01-31: qty 5

## 2015-01-31 MED ORDER — LIDOCAINE HCL (CARDIAC) 20 MG/ML IV SOLN
INTRAVENOUS | Status: DC | PRN
  Administered 2015-01-31: 50 mg via INTRAVENOUS

## 2015-01-31 MED ORDER — PROMETHAZINE HCL 25 MG/ML IJ SOLN
INTRAMUSCULAR | Status: AC
  Filled 2015-01-31: qty 1

## 2015-01-31 MED ORDER — MIDAZOLAM HCL 5 MG/5ML IJ SOLN
INTRAMUSCULAR | Status: DC | PRN
  Administered 2015-01-31: 2 mg via INTRAVENOUS

## 2015-01-31 MED ORDER — OXYCODONE HCL 5 MG/5ML PO SOLN: 5.0000 mg | ORAL | Status: DC | PRN

## 2015-01-31 MED ORDER — BACITRACIN ZINC 500 UNIT/GM EX OINT
TOPICAL_OINTMENT | CUTANEOUS | Status: AC
  Filled 2015-01-31: qty 0.9

## 2015-01-31 MED ORDER — FENTANYL CITRATE (PF) 100 MCG/2ML IJ SOLN
INTRAMUSCULAR | Status: DC | PRN
  Administered 2015-01-31: 100 ug via INTRAVENOUS

## 2015-01-31 MED ORDER — OXYMETAZOLINE HCL 0.05 % NA SOLN
NASAL | Status: DC | PRN
  Administered 2015-01-31: 1 via TOPICAL

## 2015-01-31 MED ORDER — ONDANSETRON HCL 4 MG/2ML IJ SOLN
INTRAMUSCULAR | Status: DC | PRN
  Administered 2015-01-31: 4 mg via INTRAVENOUS

## 2015-01-31 MED ORDER — DEXAMETHASONE SODIUM PHOSPHATE 10 MG/ML IJ SOLN
INTRAMUSCULAR | Status: AC
  Filled 2015-01-31: qty 1

## 2015-01-31 MED ORDER — BACITRACIN 500 UNIT/GM EX OINT
TOPICAL_OINTMENT | CUTANEOUS | Status: DC | PRN
  Administered 2015-01-31: 1 via TOPICAL

## 2015-01-31 MED ORDER — SODIUM CHLORIDE 0.9 % IR SOLN
Status: DC | PRN
  Administered 2015-01-31: 1

## 2015-01-31 MED ORDER — AMOXICILLIN 400 MG/5ML PO SUSR: 800.0000 mg | Freq: Two times a day (BID) | ORAL | Status: AC

## 2015-01-31 MED ORDER — PROPOFOL 500 MG/50ML IV EMUL
INTRAVENOUS | Status: AC
  Filled 2015-01-31: qty 50

## 2015-01-31 MED ORDER — DEXAMETHASONE SODIUM PHOSPHATE 4 MG/ML IJ SOLN
INTRAMUSCULAR | Status: DC | PRN
  Administered 2015-01-31: 10 mg via INTRAVENOUS

## 2015-01-31 MED ORDER — FENTANYL CITRATE (PF) 100 MCG/2ML IJ SOLN
INTRAMUSCULAR | Status: AC
  Filled 2015-01-31: qty 2

## 2015-01-31 MED ORDER — SUCCINYLCHOLINE CHLORIDE 20 MG/ML IJ SOLN
INTRAMUSCULAR | Status: AC
  Filled 2015-01-31: qty 1

## 2015-01-31 MED ORDER — LACTATED RINGERS IV SOLN
INTRAVENOUS | Status: DC
  Administered 2015-01-31 (×2): via INTRAVENOUS

## 2015-01-31 MED ORDER — ONDANSETRON HCL 4 MG/2ML IJ SOLN
INTRAMUSCULAR | Status: AC
  Filled 2015-01-31: qty 2

## 2015-01-31 MED ORDER — PROPOFOL 10 MG/ML IV BOLUS
INTRAVENOUS | Status: DC | PRN
  Administered 2015-01-31: 200 mg via INTRAVENOUS

## 2015-01-31 MED ORDER — OXYMETAZOLINE HCL 0.05 % NA SOLN
NASAL | Status: AC
  Filled 2015-01-31: qty 15

## 2015-01-31 MED ORDER — HYDROMORPHONE HCL 1 MG/ML IJ SOLN
0.2500 mg | INTRAMUSCULAR | Status: DC | PRN
  Administered 2015-01-31 (×3): 0.25 mg via INTRAVENOUS
  Administered 2015-01-31: 0.5 mg via INTRAVENOUS
  Administered 2015-01-31: 0.25 mg via INTRAVENOUS

## 2015-01-31 SURGICAL SUPPLY — 31 items
BANDAGE COBAN STERILE 2 (GAUZE/BANDAGES/DRESSINGS) IMPLANT
CANISTER SUCT 1200ML W/VALVE (MISCELLANEOUS) ×3 IMPLANT
CATH ROBINSON RED A/P 10FR (CATHETERS) IMPLANT
CATH ROBINSON RED A/P 14FR (CATHETERS) ×3 IMPLANT
COAGULATOR SUCT 6 FR SWTCH (ELECTROSURGICAL)
COAGULATOR SUCT SWTCH 10FR 6 (ELECTROSURGICAL) IMPLANT
COVER MAYO STAND STRL (DRAPES) ×3 IMPLANT
ELECT REM PT RETURN 9FT ADLT (ELECTROSURGICAL) ×3
ELECT REM PT RETURN 9FT PED (ELECTROSURGICAL)
ELECTRODE REM PT RETRN 9FT PED (ELECTROSURGICAL) IMPLANT
ELECTRODE REM PT RTRN 9FT ADLT (ELECTROSURGICAL) ×1 IMPLANT
GLOVE BIO SURGEON STRL SZ7.5 (GLOVE) ×3 IMPLANT
GLOVE SURG SS PI 7.0 STRL IVOR (GLOVE) ×6 IMPLANT
GOWN STRL REUS W/ TWL LRG LVL3 (GOWN DISPOSABLE) ×2 IMPLANT
GOWN STRL REUS W/TWL LRG LVL3 (GOWN DISPOSABLE) ×4
IV NS 500ML (IV SOLUTION) ×2
IV NS 500ML BAXH (IV SOLUTION) ×1 IMPLANT
MARKER SKIN DUAL TIP RULER LAB (MISCELLANEOUS) IMPLANT
NS IRRIG 1000ML POUR BTL (IV SOLUTION) ×3 IMPLANT
SHEET MEDIUM DRAPE 40X70 STRL (DRAPES) ×3 IMPLANT
SOLUTION BUTLER CLEAR DIP (MISCELLANEOUS) ×3 IMPLANT
SPONGE GAUZE 4X4 12PLY STER LF (GAUZE/BANDAGES/DRESSINGS) ×3 IMPLANT
SPONGE TONSIL 1 RF SGL (DISPOSABLE) IMPLANT
SPONGE TONSIL 1.25 RF SGL STRG (GAUZE/BANDAGES/DRESSINGS) ×3 IMPLANT
SYR BULB 3OZ (MISCELLANEOUS) IMPLANT
TOWEL OR 17X24 6PK STRL BLUE (TOWEL DISPOSABLE) ×3 IMPLANT
TUBE CONNECTING 20'X1/4 (TUBING) ×1
TUBE CONNECTING 20X1/4 (TUBING) ×2 IMPLANT
TUBE SALEM SUMP 12R W/ARV (TUBING) IMPLANT
TUBE SALEM SUMP 16 FR W/ARV (TUBING) ×3 IMPLANT
WAND COBLATOR 70 EVAC XTRA (SURGICAL WAND) ×3 IMPLANT

## 2015-01-31 NOTE — Anesthesia Procedure Notes (Signed)
Procedure Name: Intubation Date/Time: 01/31/2015 9:37 AM Performed by: Caren MacadamARTER, Mccall Lomax W Pre-anesthesia Checklist: Patient identified, Emergency Drugs available, Suction available and Patient being monitored Patient Re-evaluated:Patient Re-evaluated prior to inductionOxygen Delivery Method: Circle System Utilized Preoxygenation: Pre-oxygenation with 100% oxygen Intubation Type: IV induction Ventilation: Mask ventilation without difficulty Laryngoscope Size: Miller and 2 Grade View: Grade I Tube type: Oral Tube size: 7.0 mm Number of attempts: 1 Airway Equipment and Method: Stylet and Oral airway Placement Confirmation: ETT inserted through vocal cords under direct vision,  positive ETCO2 and breath sounds checked- equal and bilateral Secured at: 22 cm Tube secured with: Tape Dental Injury: Teeth and Oropharynx as per pre-operative assessment

## 2015-01-31 NOTE — Discharge Instructions (Addendum)
[facility] °Postoperative Instructions for Tonsillectomy & Adenoidectomy (T&A) °Activity °Restrict activity at home for the first two days, resting as much as possible. Light indoor activity is best. You may usually return to school or work within a week but void strenuous activity and sports for two weeks. Sleep with your head elevated on 2-3 pillows for 3-4 days to help decrease swelling. °Diet °Due to tissue swelling and throat discomfort, you may have little desire to drink for several days. However fluids are very important to prevent dehydration. You will find that non-acidic juices, soups, popsicles, Jell-O, custard, puddings, and any soft or mashed foods taken in small quantities can be swallowed fairly easily. Try to increase your fluid and food intake as the discomfort subsides. It is recommended that a child receive 1-1/2 quarts of fluid in a 24-hour period. Adult require twice this amount.  °Discomfort °Your sore throat may be relieved by applying an ice collar to your neck and/or by taking Tylenol®. You may experience an earache, which is due to referred pain from the throat. Referred ear pain is commonly felt at night when trying to rest. ° °Bleeding                        Although rare, there is risk of having some bleeding during the first 2 weeks after having a T&A. This usually happens between days 7-10 postoperatively. If you or your child should have any bleeding, try to remain calm. We recommend sitting up quietly in a chair and gently spitting out the blood into a bowl. For adults, gargling gently with ice water may help. If the bleeding does not stop after a short time (5 minutes), is more than 1 teaspoonful, or if you become worried, please call our office at [phone] or go directly to the nearest hospital emergency room. Do not eat or drink anything prior to going to the hospital as you may need to be taken to the operating room in order to control the bleeding. °GENERAL  CONSIDERATIONS °1. Brush your teeth regularly. Avoid mouthwashes and gargles for three weeks. You may gargle gently with warm salt-water as necessary or spray with Chloraseptic®. You may make salt-water by placing 2 teaspoons of table salt into a quart of fresh water. Warm the salt-water in a microwave to a luke warm temperature.  °2. Avoid exposure to colds and upper respiratory infections if possible.  °3. If you look into a mirror or into your child's mouth, you will see white-gray patches in the back of the throat. This is normal after having a T&A and is like a scab that forms on the skin after an abrasion. It will disappear once the back of the throat heals completely. However, it may cause a noticeable odor; this too will disappear with time. Again, warm salt-water gargles may be used to help keep the throat clean and promote healing.  °4. You may notice a temporary change in voice quality, such as a higher pitched voice or a nasal sound, until healing is complete. This may last for 1-2 weeks and should resolve.  °5. Do not take or give you child any medications that we have not prescribed or recommended.  °6. Snoring may occur, especially at night, for the first week after a T&A. It is due to swelling of the soft palate and will usually resolve.  °Please call our office at [phone] if you have any questions.   ° ° ° °  Post Anesthesia Home Care Instructions ° °Activity: °Get plenty of rest for the remainder of the day. A responsible adult should stay with you for 24 hours following the procedure.  °For the next 24 hours, DO NOT: °-Drive a car °-Operate machinery °-Drink alcoholic beverages °-Take any medication unless instructed by your physician °-Make any legal decisions or sign important papers. ° °Meals: °Start with liquid foods such as gelatin or soup. Progress to regular foods as tolerated. Avoid greasy, spicy, heavy foods. If nausea and/or vomiting occur, drink only clear liquids until the nausea  and/or vomiting subsides. Call your physician if vomiting continues. ° °Special Instructions/Symptoms: °Your throat may feel dry or sore from the anesthesia or the breathing tube placed in your throat during surgery. If this causes discomfort, gargle with warm salt water. The discomfort should disappear within 24 hours. ° °If you had a scopolamine patch placed behind your ear for the management of post- operative nausea and/or vomiting: ° °1. The medication in the patch is effective for 72 hours, after which it should be removed.  Wrap patch in a tissue and discard in the trash. Wash hands thoroughly with soap and water. °2. You may remove the patch earlier than 72 hours if you experience unpleasant side effects which may include dry mouth, dizziness or visual disturbances. °3. Avoid touching the patch. Wash your hands with soap and water after contact with the patch. °  ° °

## 2015-01-31 NOTE — Op Note (Signed)
DATE OF PROCEDURE:  01/31/2015                              OPERATIVE REPORT  SURGEON:  Newman PiesSu Kendrell Lottman, MD  PREOPERATIVE DIAGNOSES: 1. Adenotonsillar hypertrophy. 2. Chronic tonsillitis and pharyngitis  POSTOPERATIVE DIAGNOSES: 1. Adenotonsillar hypertrophy. 2. Chronic tonsillitis and pharyngitis  PROCEDURE PERFORMED:  Adenotonsillectomy.  ANESTHESIA:  General endotracheal tube anesthesia.  COMPLICATIONS:  None.  ESTIMATED BLOOD LOSS:  Minimal.  INDICATION FOR PROCEDURE:  Karen Suarez is a 19 y.o. female with a history of chronic tonsillitis/pharyngitis and halitosis.  According to the patient, she has been experiencing chronic throat discomfort with halitosis for several years. The patient continues to be symptomatic despite medical treatments. On examination, the patient was noted to have bilateral cryptic tonsils, with numerous tonsilloliths. Based on the above findings, the decision was made for the patient to undergo the adenotonsillectomy procedure. Likelihood of success in reducing symptoms was also discussed.  The risks, benefits, alternatives, and details of the procedure were discussed with the mother.  Questions were invited and answered.  Informed consent was obtained.  DESCRIPTION:  The patient was taken to the operating room and placed supine on the operating table.  General endotracheal tube anesthesia was administered by the anesthesiologist.  The patient was positioned and prepped and draped in a standard fashion for adenotonsillectomy.  A Crowe-Davis mouth gag was inserted into the oral cavity for exposure. 2+ cryptic tonsils were noted bilaterally.  No bifidity was noted.  Indirect mirror examination of the nasopharynx revealed mild adenoid hypertrophy. The adenoid was ablated with the Coblator device. Hemostasis was achieved with the Coblator device.  The right tonsil was then grasped with a straight Allis clamp and retracted medially.  It was resected free from the underlying  pharyngeal constrictor muscles with the Coblator device.  The same procedure was repeated on the left side without exception.  The surgical sites were copiously irrigated.  The mouth gag was removed.  The care of the patient was turned over to the anesthesiologist.  The patient was awakened from anesthesia without difficulty.  The patient was extubated and transferred to the recovery room in good condition.  OPERATIVE FINDINGS:  Adenotonsillar hypertrophy.  SPECIMEN:  Bilateral tonsils  FOLLOWUP CARE:  The patient will be discharged home once awake and alert.  She will be placed on amoxicillin 800 mg p.o. b.i.d. for 5 days, and oxycodone 5-1110ml po q 4 hours for postop pain control.   The patient will follow up in my office in approximately 2 weeks.  Ronold Hardgrove,SUI W 01/31/2015 10:09 AM

## 2015-01-31 NOTE — Transfer of Care (Signed)
Immediate Anesthesia Transfer of Care Note  Patient: Kendyll P Ward  Procedure(s) Performed: Procedure(s): TONSILLECTOMY AND ADENOIDECTOMY (N/A)  Patient Location: PACU  Anesthesia Type:General  Level of Consciousness: awake  Airway & Oxygen Therapy: Patient Spontanous Breathing and Patient connected to face mask oxygen  Post-op Assessment: Report given to RN and Post -op Vital signs reviewed and stable  Post vital signs: Reviewed and stable  Last Vitals:  Filed Vitals:   01/31/15 0812  BP: 121/65  Pulse: 85  Temp: 36.7 C  Resp: 20    Complications: No apparent anesthesia complications

## 2015-01-31 NOTE — H&P (Signed)
Cc: Chronic sore throat, halitosis  HPI: The patient is a 19 y/o female who presents today with complaints of recurrent sore throat and frequent halitosis. The patient is [facility] in [facility]. The patient has noted frequent sore throat for the past year. She also complains of bothersome tonsil stones which cause discomfort and halitosis. The patient denies any significant history of snoring. She is otherwise healthy. No previous ENT surgery is noted.   The patient's review of systems (constitutional, eyes, ENT, cardiovascular, respiratory, GI, musculoskeletal, skin, neurologic, psychiatric, endocrine, hematologic, allergic) is noted in the ROS questionnaire.  It is reviewed with the patient.   Family health history: Diabetes, Problems with anesthesia.   Major events: None.   Ongoing medical problems: Headaches, nausea.   Social history: The patient is single. She denies the use of alcohol, tobacco or illegal drugs..   Exam General: Communicates without difficulty, well nourished, no acute distress. Head: Normocephalic, no evidence injury, no tenderness, facial buttresses intact without stepoff. Eyes: PERRL, EOMI.  No scleral icterus, conjunctivae clear. Ears: External auditory canals clear bilaterally.  There is no edema or erythema.  Tympanic membrane is within normal limits bilaterally. Nose: Normal skin and external support.  Anterior rhinoscopy reveals healthy pink mucosa over the septum and turbinates.  No lesions or polyps were seen. Oral cavity: Lips without lesions, oral mucosa moist, no masses or lesions seen. Tonsils 1+, cryptic. Pharynx: Clear, no erythema. Neck: Supple, full range of motion, no lymphadenopathy, no masses palpable. Salivary: Parotid and submandibular glands without mass. Neuro:  CN 2-12 grossly intact. Gait normal. Vestibular: No nystagmus at any point of gaze.   Assessment 1.  The patient's history and physical exam findings are  consistent with chronic tonsillitis/pharyngitis and recurrent tonsilloliths resulting in halitosis. Tonsils are 1+ and cryptic.    Plan 1. The treatment options include continuing conservative observation, irrigation with the use of a water pik, or adenotonsillectomy.  Based on the patient's history and physical exam findings, the patient will likely benefit from having the tonsils and adenoid removed.  The risks, benefits, alternatives, and details of the procedure are reviewed with the patient.  Questions are invited and answered.  2. The patient is interested in proceeding with the procedure.  We will schedule the procedure in accordance with the family schedule.

## 2015-02-01 ENCOUNTER — Encounter (HOSPITAL_BASED_OUTPATIENT_CLINIC_OR_DEPARTMENT_OTHER): Payer: Self-pay | Admitting: Otolaryngology

## 2015-02-01 NOTE — Anesthesia Preprocedure Evaluation (Addendum)
Anesthesia Evaluation  Patient identified by MRN, date of birth, ID band Patient awake    Airway Mallampati: II  TM Distance: >3 FB Neck ROM: Full    Dental   Pulmonary neg pulmonary ROS,    breath sounds clear to auscultation       Cardiovascular negative cardio ROS   Rhythm:Regular Rate:Normal     Neuro/Psych    GI/Hepatic Neg liver ROS, GERD  ,  Endo/Other  negative endocrine ROS  Renal/GU negative Renal ROS     Musculoskeletal   Abdominal   Peds  Hematology   Anesthesia Other Findings   Reproductive/Obstetrics                            Anesthesia Physical Anesthesia Plan  ASA: II  Anesthesia Plan: General   Post-op Pain Management:    Induction: Intravenous  Airway Management Planned: Oral ETT  Additional Equipment:   Intra-op Plan:   Post-operative Plan: Extubation in OR  Informed Consent: I have reviewed the patients History and Physical, chart, labs and discussed the procedure including the risks, benefits and alternatives for the proposed anesthesia with the patient or authorized representative who has indicated his/her understanding and acceptance.   Dental advisory given  Plan Discussed with: CRNA and Anesthesiologist  Anesthesia Plan Comments:        Anesthesia Quick Evaluation

## 2015-02-03 NOTE — Anesthesia Postprocedure Evaluation (Signed)
Anesthesia Post Note  Patient: Karen Suarez Ward  Procedure(s) Performed: Procedure(s) (LRB): TONSILLECTOMY AND ADENOIDECTOMY (N/A)  Patient location during evaluation: PACU Anesthesia Type: General Level of consciousness: awake Pain management: pain level controlled Respiratory status: spontaneous breathing Cardiovascular status: blood pressure returned to baseline Anesthetic complications: no    Last Vitals:  Filed Vitals:   01/31/15 1130 01/31/15 1222  BP: 115/68 112/81  Pulse: 88 71  Temp:  36.4 C  Resp: 14 16    Last Pain:  Filed Vitals:   02/01/15 1504  PainSc: 5                  EDWARDS,Damyan Corne

## 2015-02-06 NOTE — Anesthesia Postprocedure Evaluation (Signed)
Anesthesia Post Note  Patient: Karen Suarez  Procedure(s) Performed: Procedure(s) (LRB): TONSILLECTOMY AND ADENOIDECTOMY (N/A)  Patient location during evaluation: PACU Anesthesia Type: General Level of consciousness: awake Pain management: pain level controlled Vital Signs Assessment: post-procedure vital signs reviewed and stable Respiratory status: spontaneous breathing Cardiovascular status: stable Anesthetic complications: no    Last Vitals:  Filed Vitals:   01/31/15 1130 01/31/15 1222  BP: 115/68 112/81  Pulse: 88 71  Temp:  36.4 C  Resp: 14 16    Last Pain:  Filed Vitals:   02/01/15 1504  PainSc: 5                  EDWARDS,Babbette Dalesandro

## 2015-02-06 NOTE — Addendum Note (Signed)
Addendum  created 02/06/15 1621 by Judie Petitharlene Edwards, MD   Modules edited: Clinical Notes   Clinical Notes:  File: [id number]; Pend: [id number]

## 2015-07-07 ENCOUNTER — Ambulatory Visit (INDEPENDENT_AMBULATORY_CARE_PROVIDER_SITE_OTHER): Payer: BLUE CROSS/BLUE SHIELD | Admitting: Family Medicine

## 2015-07-07 ENCOUNTER — Encounter: Payer: Self-pay | Admitting: Family Medicine

## 2015-07-07 VITALS — Temp 98.2°F | Ht 61.0 in | Wt 186.4 lb

## 2015-07-07 DIAGNOSIS — J0100 Acute maxillary sinusitis, unspecified: Secondary | ICD-10-CM | POA: Diagnosis not present

## 2015-07-07 MED ORDER — CEFPROZIL 500 MG PO TABS: 500.0000 mg | ORAL_TABLET | Freq: Two times a day (BID) | ORAL | Status: DC

## 2015-07-07 MED ORDER — AMOXICILLIN 500 MG PO TABS: 500.0000 mg | ORAL_TABLET | Freq: Three times a day (TID) | ORAL | Status: DC

## 2015-07-07 NOTE — Progress Notes (Signed)
   Subjective:    Patient ID: Karen Suarez, female    DOB: 1995-11-22, 22 y.o.   MRN: [id number]  Sinus Problem This is a new problem. The current episode started in the past 7 days. Associated symptoms include congestion, coughing, ear pain, headaches, sinus pressure and a sore throat. Pertinent negatives include no shortness of breath. (Fever) Treatments tried: otc cold med.   Sinus pressure over the past 6-8 days worse over the past few days radiates into the ears. Denies high fever chills sweats. Denies wheezing or difficulty breathing.   Review of Systems  Constitutional: Negative for fever and activity change.  HENT: Positive for congestion, ear pain, rhinorrhea, sinus pressure and sore throat.   Eyes: Negative for discharge.  Respiratory: Positive for cough. Negative for shortness of breath and wheezing.   Cardiovascular: Negative for chest pain.  Neurological: Positive for headaches.       Objective:   Physical Exam  Constitutional: She appears well-developed.  HENT:  Head: Normocephalic.  Nose: Nose normal.  Mouth/Throat: Oropharynx is clear and moist. No oropharyngeal exudate.  Neck: Neck supple.  Cardiovascular: Normal rate and normal heart sounds.   No murmur heard. Pulmonary/Chest: Effort normal and breath sounds normal. She has no wheezes.  Lymphadenopathy:    She has no cervical adenopathy.  Skin: Skin is warm and dry.  Nursing note and vitals reviewed.    Moderate maxillary sinus tenderness eardrums normal lungs no crackles     Assessment & Plan:  Viral syndrome Secondary rhinosinusitis Antibiotics prescribed warning signs discussed follow-up if problems 2 different antibiotics were prescribed patient will see which one she can afford I recommend Cefzil but if she cannot afford that amoxicillin should work fine

## 2015-09-26 ENCOUNTER — Other Ambulatory Visit: Payer: Self-pay | Admitting: Adult Health

## 2015-09-26 NOTE — Telephone Encounter (Signed)
Tell pt needs appt 

## 2015-12-15 ENCOUNTER — Other Ambulatory Visit: Payer: Self-pay | Admitting: Adult Health

## 2016-01-31 ENCOUNTER — Other Ambulatory Visit: Payer: Self-pay | Admitting: Adult Health

## 2016-02-18 ENCOUNTER — Other Ambulatory Visit: Payer: Self-pay | Admitting: Adult Health

## 2016-04-23 ENCOUNTER — Other Ambulatory Visit: Payer: Self-pay | Admitting: Adult Health

## 2016-08-31 ENCOUNTER — Other Ambulatory Visit: Payer: Self-pay | Admitting: Adult Health

## 2016-08-31 ENCOUNTER — Telehealth: Payer: Self-pay | Admitting: *Deleted

## 2016-08-31 NOTE — Telephone Encounter (Signed)
Informed patient she needs appointment in order for refill.

## 2016-08-31 NOTE — Telephone Encounter (Signed)
Informed patient she needed an appointment in order for her BCP to be refilled. Informed note was put on last refill that she needed an appointment. Patient states she is out and needs something sent today. Informed patient Victorino DikeJennifer was out of the office until Monday and she needed to use condoms or remain abstinent until she was seen. Patient very upset that she was not told by her pharmacy of the message.  Can refill be sent for 1 pack? Please advise.

## 2016-09-04 ENCOUNTER — Other Ambulatory Visit: Payer: BLUE CROSS/BLUE SHIELD | Admitting: Adult Health

## 2016-09-07 ENCOUNTER — Encounter: Payer: Self-pay | Admitting: Nurse Practitioner

## 2016-09-07 ENCOUNTER — Ambulatory Visit (INDEPENDENT_AMBULATORY_CARE_PROVIDER_SITE_OTHER): Payer: BLUE CROSS/BLUE SHIELD | Admitting: Nurse Practitioner

## 2016-09-07 ENCOUNTER — Other Ambulatory Visit: Payer: Self-pay | Admitting: Nurse Practitioner

## 2016-09-07 VITALS — BP 112/80 | Temp 99.1°F | Ht 61.0 in | Wt 184.0 lb

## 2016-09-07 DIAGNOSIS — J300 Vasomotor rhinitis: Secondary | ICD-10-CM

## 2016-09-07 DIAGNOSIS — G43109 Migraine with aura, not intractable, without status migrainosus: Secondary | ICD-10-CM | POA: Diagnosis not present

## 2016-09-07 MED ORDER — TOPIRAMATE 50 MG PO TABS: ORAL_TABLET | ORAL | 0 refills | Status: DC

## 2016-09-07 MED ORDER — NAPROXEN 500 MG PO TABS: ORAL_TABLET | ORAL | 0 refills | Status: DC

## 2016-09-07 MED ORDER — RANITIDINE HCL 300 MG PO TABS: 300.0000 mg | ORAL_TABLET | Freq: Every day | ORAL | 5 refills | Status: DC

## 2016-09-07 MED ORDER — ONDANSETRON 8 MG PO TBDP: 8.0000 mg | ORAL_TABLET | Freq: Three times a day (TID) | ORAL | 0 refills | Status: DC | PRN

## 2016-09-07 MED ORDER — RIZATRIPTAN BENZOATE 10 MG PO TBDP: 10.0000 mg | ORAL_TABLET | ORAL | 0 refills | Status: DC | PRN

## 2016-09-07 NOTE — Patient Instructions (Addendum)
claritin or allegra nasacort or rhinocort    Migraine Headache A migraine headache is an intense, throbbing pain on one side or both sides of the head. Migraines may also cause other symptoms, such as nausea, vomiting, and sensitivity to light and noise. What are the causes? Doing or taking certain things may also trigger migraines, such as:  Alcohol.  Smoking.  Medicines, such as: ? Medicine used to treat chest pain (nitroglycerine). ? Birth control pills. ? Estrogen pills. ? Certain blood pressure medicines.  Aged cheeses, chocolate, or caffeine.  Foods or drinks that contain nitrates, glutamate, aspartame, or tyramine.  Physical activity.  Other things that may trigger a migraine include:  Menstruation.  Pregnancy.  Hunger.  Stress, lack of sleep, too much sleep, or fatigue.  Weather changes.  What increases the risk? The following factors may make you more likely to experience migraine headaches:  Age. Risk increases with age.  Family history of migraine headaches.  Being Caucasian.  Depression and anxiety.  Obesity.  Being a woman.  Having a hole in the heart (patent foramen ovale) or other heart problems.  What are the signs or symptoms? The main symptom of this condition is pulsating or throbbing pain. Pain may:  Happen in any area of the head, such as on one side or both sides.  Interfere with daily activities.  Get worse with physical activity.  Get worse with exposure to bright lights or loud noises.  Other symptoms may include:  Nausea.  Vomiting.  Dizziness.  General sensitivity to bright lights, loud noises, or smells.  Before you get a migraine, you may get warning signs that a migraine is developing (aura). An aura may include:  Seeing flashing lights or having blind spots.  Seeing bright spots, halos, or zigzag lines.  Having tunnel vision or blurred vision.  Having numbness or a tingling feeling.  Having trouble  talking.  Having muscle weakness.  How is this diagnosed? A migraine headache can be diagnosed based on:  Your symptoms.  A physical exam.  Tests, such as CT scan or MRI of the head. These imaging tests can help rule out other causes of headaches.  Taking fluid from the spine (lumbar puncture) and analyzing it (cerebrospinal fluid analysis, or CSF analysis).  How is this treated? A migraine headache is usually treated with medicines that:  Relieve pain.  Relieve nausea.  Prevent migraines from coming back.  Treatment may also include:  Acupuncture.  Lifestyle changes like avoiding foods that trigger migraines.  Follow these instructions at home: Medicines  Take over-the-counter and prescription medicines only as told by your health care provider.  Do not drive or use heavy machinery while taking prescription pain medicine.  To prevent or treat constipation while you are taking prescription pain medicine, your health care provider may recommend that you: ? Drink enough fluid to keep your urine clear or pale yellow. ? Take over-the-counter or prescription medicines. ? Eat foods that are high in fiber, such as fresh fruits and vegetables, whole grains, and beans. ? Limit foods that are high in fat and processed sugars, such as fried and sweet foods. Lifestyle  Avoid alcohol use.  Do not use any products that contain nicotine or tobacco, such as cigarettes and e-cigarettes. If you need help quitting, ask your health care provider.  Get at least 8 hours of sleep every night.  Limit your stress. General instructions   Keep a journal to find out what may trigger your migraine headaches.  For example, write down: ? What you eat and drink. ? How much sleep you get. ? Any change to your diet or medicines.  If you have a migraine: ? Avoid things that make your symptoms worse, such as bright lights. ? It may help to lie down in a dark, quiet room. ? Do not drive or  use heavy machinery. ? Ask your health care provider what activities are safe for you while you are experiencing symptoms.  Keep all follow-up visits as told by your health care provider. This is important. Contact a health care provider if:  You develop symptoms that are different or more severe than your usual migraine symptoms. Get help right away if:  Your migraine becomes severe.  You have a fever.  You have a stiff neck.  You have vision loss.  Your muscles feel weak or like you cannot control them.  You start to lose your balance often.  You develop trouble walking.  You faint. This information is not intended to replace advice given to you by your health care provider. Make sure you discuss any questions you have with your health care provider. Document Released: 02/05/2005 Document Revised: 08/26/2015 Document Reviewed: 07/25/2015 [employer] Interactive Patient Education  2017 [employer]

## 2016-09-08 ENCOUNTER — Encounter: Payer: Self-pay | Admitting: Nurse Practitioner

## 2016-09-08 DIAGNOSIS — G43109 Migraine with aura, not intractable, without status migrainosus: Secondary | ICD-10-CM | POA: Insufficient documentation

## 2016-09-08 NOTE — Progress Notes (Signed)
Subjective:  Presents for c/o headaches for the past 3 weeks. Starts with a weird smell like something burning. Then has a pounding headache lasting hours. Relieved with sleep/rest. No relief with Ibuprofen. Usually occurs about midday. Photosensitivity. No phonophobia. No visual changes. Nausea, no vomiting. No difficulty sleeping or swallowing. No numbness or weakness of the face arms or legs. Occurs everyday. Has not identified any specific triggers. Unrelated to menstrual cycle. On birth control pills. Was a day late starting this pack. No fever, sore throat or ear pain. Some sinus pressure. Runny nose. Slight cough.   Objective:   BP 112/80   Temp 99.1 F (37.3 C) (Oral)   Ht 5\' 1"  (1.549 m)   Wt 184 lb 0.6 oz (83.5 kg)   BMI 34.77 kg/m  NAD. Alert, oriented. Fundoscopic exam: optic disk sharp. Pupils equal and reactive to light. TMs clear effusion. Pharynx clear. Neck supple with mild anterior adenopathy. Lungs clear. Heart RRR. Refluxes normal.   Assessment:   Problem List Items Addressed This Visit      Cardiovascular and Mediastinum   Migraine with aura and without status migrainosus, not intractable - Primary   Relevant Medications   topiramate (TOPAMAX) 50 MG tablet   rizatriptan (MAXALT-MLT) 10 MG disintegrating tablet   naproxen (NAPROSYN) 500 MG tablet    Other Visit Diagnoses    Vasomotor rhinitis           Plan:   Meds ordered this encounter  Medications  . topiramate (TOPAMAX) 50 MG tablet    Sig: Take 1/2 tab at bedtime for 6 days then one po qhs.    Dispense:  30 tablet    Refill:  0    Order Specific Question:   Supervising Provider    Answer:   Merlyn Albert [[id number]]  . rizatriptan (MAXALT-MLT) 10 MG disintegrating tablet    Sig: Take 1 tablet (10 mg total) by mouth as needed for migraine. May repeat in 2 hours if needed; max 2 per 24 hrs    Dispense:  10 tablet    Refill:  0    Order Specific Question:   Supervising Provider    Answer:   Merlyn Albert [[id number]]  . ondansetron (ZOFRAN-ODT) 8 MG disintegrating tablet    Sig: Take 1 tablet (8 mg total) by mouth every 8 (eight) hours as needed for nausea or vomiting.    Dispense:  30 tablet    Refill:  0    Order Specific Question:   Supervising Provider    Answer:   Merlyn Albert [[id number]]  . ranitidine (ZANTAC) 300 MG tablet    Sig: Take 1 tablet (300 mg total) by mouth at bedtime. For reflux    Dispense:  30 tablet    Refill:  5    Order Specific Question:   Supervising Provider    Answer:   Merlyn Albert [[id number]]  . naproxen (NAPROSYN) 500 MG tablet    Sig: Take one po at onset of migraine with Maxalt    Dispense:  30 tablet    Refill:  0    Order Specific Question:   Supervising Provider    Answer:   Merlyn Albert [[id number]]   Patient understands that Topamax should not be taken during pregnancy due to risk of birth defects. Has no plans for pregnancy at this time. Use condoms until this pack of oc's are complete. Take Naproxen and Maxalt at onset of migraine as directed. Use app to  keep headache diary.  claritin or allegra nasacort or rhinocort  At end of visit, patient requested medication to help with reflux. Given Rx Zantac.  Return in about 3 weeks (around 09/28/2016) for migraine recheck.

## 2016-09-18 ENCOUNTER — Inpatient Hospital Stay: Admission: RE | Admit: 2016-09-18 | Payer: Self-pay | Source: Ambulatory Visit

## 2016-09-18 ENCOUNTER — Ambulatory Visit (INDEPENDENT_AMBULATORY_CARE_PROVIDER_SITE_OTHER): Payer: BLUE CROSS/BLUE SHIELD | Admitting: Adult Health

## 2016-09-18 ENCOUNTER — Encounter: Payer: Self-pay | Admitting: Adult Health

## 2016-09-18 ENCOUNTER — Other Ambulatory Visit (HOSPITAL_COMMUNITY)
Admission: RE | Admit: 2016-09-18 | Discharge: 2016-09-18 | Disposition: A | Discharge status: Home / Self Care | Payer: BLUE CROSS/BLUE SHIELD | Source: Ambulatory Visit | Attending: Adult Health | Admitting: Adult Health

## 2016-09-18 VITALS — BP 122/90 | HR 84 | Ht 61.0 in | Wt 188.4 lb

## 2016-09-18 DIAGNOSIS — Z01419 Encounter for gynecological examination (general) (routine) without abnormal findings: Secondary | ICD-10-CM | POA: Insufficient documentation

## 2016-09-18 DIAGNOSIS — Z3041 Encounter for surveillance of contraceptive pills: Secondary | ICD-10-CM

## 2016-09-18 NOTE — Progress Notes (Signed)
Patient ID: Karen Suarez, female   DOB: 1996/02/12, 21 y.o.   MRN: [id number] History of Present Illness: Karen Suarez is a 21 year old white female, married in for a well woman gyn exam and pap. PCP is Bobette Mo Hoskins, FNP.    Current Medications, Allergies, Past Medical History, Past Surgical History, Family History and Social History were reviewed in [facility] Link electronic medical record.     Review of Systems: Patient denies any daily  Headaches(but has migraines), hearing loss, fatigue, blurred vision, shortness of breath, chest pain, abdominal pain, problems with bowel movements, urination, or intercourse. No joint pain or mood swings. She is happy with her OCs.    Physical Exam:BP 122/90 (BP Location: Left Arm, Patient Position: Sitting, Cuff Size: Small)   Pulse 84   Ht 5\' 1"  (1.549 m)   Wt 188 lb 6.4 oz (85.5 kg)   LMP 09/04/2016   BMI 35.60 kg/m  General:  Well developed, well nourished, no acute distress Skin:  Warm and dry Neck:  Midline trachea, normal thyroid, good ROM, no lymphadenopathy Lungs; Clear to auscultation bilaterally Breast:  No dominant palpable mass, retraction, or nipple discharge Cardiovascular: Regular rate and rhythm Abdomen:  Soft, non tender, no hepatosplenomegaly Pelvic:  External genitalia is normal in appearance, no lesions.  The vagina is normal in appearance. Urethra has no lesions or masses. The cervix is smooth, pap with reflex HPV performed.  Uterus is felt to be normal size, shape, and contour.  No adnexal masses or tenderness noted.Bladder is non tender, no masses felt. Extremities/musculoskeletal:  No swelling or varicosities noted, no clubbing or cyanosis Psych:  No mood changes, alert and cooperative,seems happy PHQ 2 score 0.  Impression: 1. Encounter for gynecological examination with Papanicolaou smear of cervix   2. Encounter for surveillance of contraceptive pills       Plan: Physical in 1 year, pap in 2- if normal Continue OCs  has refills

## 2016-09-18 NOTE — Addendum Note (Signed)
Addended by: Federico FlakeNES, PEGGY A on: 09/18/2016 03:02 PM   Modules accepted: Orders

## 2016-09-21 LAB — CYTOLOGY - PAP
Diagnosis: NEGATIVE
HPV: NOT DETECTED

## 2016-09-28 ENCOUNTER — Ambulatory Visit: Payer: BLUE CROSS/BLUE SHIELD | Admitting: Nurse Practitioner

## 2016-10-01 ENCOUNTER — Encounter: Payer: Self-pay | Admitting: Nurse Practitioner

## 2016-10-01 ENCOUNTER — Ambulatory Visit (INDEPENDENT_AMBULATORY_CARE_PROVIDER_SITE_OTHER): Payer: BLUE CROSS/BLUE SHIELD | Admitting: Nurse Practitioner

## 2016-10-01 VITALS — BP 118/76 | Ht 61.0 in | Wt 188.0 lb

## 2016-10-01 DIAGNOSIS — G43109 Migraine with aura, not intractable, without status migrainosus: Secondary | ICD-10-CM

## 2016-10-01 DIAGNOSIS — J300 Vasomotor rhinitis: Secondary | ICD-10-CM

## 2016-10-01 DIAGNOSIS — K219 Gastro-esophageal reflux disease without esophagitis: Secondary | ICD-10-CM | POA: Diagnosis not present

## 2016-10-01 MED ORDER — METHYLPREDNISOLONE ACETATE 40 MG/ML IJ SUSP
40.0000 mg | Freq: Once | INTRAMUSCULAR | Status: AC
  Administered 2016-10-01: 40 mg via INTRAMUSCULAR

## 2016-10-01 MED ORDER — PANTOPRAZOLE SODIUM 40 MG PO TBEC: 40.0000 mg | DELAYED_RELEASE_TABLET | Freq: Every day | ORAL | 2 refills | Status: DC

## 2016-10-01 MED ORDER — ONDANSETRON 8 MG PO TBDP: 8.0000 mg | ORAL_TABLET | Freq: Three times a day (TID) | ORAL | 0 refills | Status: DC | PRN

## 2016-10-01 NOTE — Progress Notes (Signed)
Subjective:  Presents for recheck on her migraines. No migraines until today. Maxalt and Naproxen working well. Defers daily med at this point unless headaches become more frequent. Did not start Topamax due to concerns mentioned by pharmacist. Reflux flare up not controlled on Zantac. Nausea. Non smoker, no alcohol use, no NSAIDs, limited caffeine. Stress level good. Has elevated the head of her bed. Slight constipation. No fevers. Has swollen area deep in left nostril. First noticed 7/20. Non tender. Mild head congestion. No cough, runny nose or sore throat. FMH: both parents have GERD.   Objective:   BP 118/76   Ht 5\' 1"  (1.549 m)   Wt 188 lb (85.3 kg)   LMP 09/04/2016   BMI 35.52 kg/m  NAD. Alert, oriented. TMs mild clear effusion. Nasal mucosa clear. No lesions noted but area is deeper in the nostril according to patient. Pharynx non erythematous with PND noted. Neck supple with mild anterior adenopathy. Lungs clear. Heart RRR. Abdomen soft, non distended with mild upper epigastric tenderness. No rebound or guarding. No obvious masses.   Assessment:   Problem List Items Addressed This Visit      Cardiovascular and Mediastinum   Migraine with aura and without status migrainosus, not intractable - Primary   Relevant Medications   methylPREDNISolone acetate (DEPO-MEDROL) injection 40 mg (Completed)     Digestive   Gastroesophageal reflux disease without esophagitis   Relevant Medications   pantoprazole (PROTONIX) 40 MG tablet   ondansetron (ZOFRAN-ODT) 8 MG disintegrating tablet    Other Visit Diagnoses    Vasomotor rhinitis       Relevant Medications   methylPREDNISolone acetate (DEPO-MEDROL) injection 40 mg (Completed)       Plan:   Meds ordered this encounter  Medications  . pantoprazole (PROTONIX) 40 MG tablet    Sig: Take 1 tablet (40 mg total) by mouth daily.    Dispense:  30 tablet    Refill:  2    Order Specific Question:   Supervising Provider    Answer:   Merlyn AlbertLUKING,  WILLIAM S [[id number]]  . ondansetron (ZOFRAN-ODT) 8 MG disintegrating tablet    Sig: Take 1 tablet (8 mg total) by mouth every 8 (eight) hours as needed for nausea or vomiting.    Dispense:  30 tablet    Refill:  0    Order Specific Question:   Supervising Provider    Answer:   Merlyn AlbertLUKING, WILLIAM S [[id number]]  . methylPREDNISolone acetate (DEPO-MEDROL) injection 40 mg   Restart PPI. Discussed risks with long term use. Call back in 4-6 weeks if not resolved. Add steroid nasal spray to regimen . Call back in 2 weeks if pressure in left nostril persists. Call back if increased frequency of migraines.

## 2016-10-01 NOTE — Patient Instructions (Signed)
Hold on Ranitidine. Start Pantoprazole daily. Call back in 4-6 weeks if persists. Call back in 2 weeks if pressure in left nostril persists.

## 2016-10-05 ENCOUNTER — Other Ambulatory Visit: Payer: Self-pay | Admitting: Nurse Practitioner

## 2016-10-08 MED ORDER — RIZATRIPTAN BENZOATE 10 MG PO TBDP: 10.0000 mg | ORAL_TABLET | ORAL | 0 refills | Status: DC | PRN

## 2016-10-28 ENCOUNTER — Encounter: Payer: Self-pay | Admitting: Nurse Practitioner

## 2016-10-29 ENCOUNTER — Other Ambulatory Visit: Payer: Self-pay | Admitting: Nurse Practitioner

## 2016-10-29 MED ORDER — PANTOPRAZOLE SODIUM 40 MG PO TBEC: 40.0000 mg | DELAYED_RELEASE_TABLET | Freq: Every day | ORAL | 2 refills | Status: DC

## 2016-10-30 ENCOUNTER — Telehealth: Payer: Self-pay | Admitting: Family Medicine

## 2016-10-30 ENCOUNTER — Telehealth: Payer: Self-pay | Admitting: *Deleted

## 2016-10-30 MED ORDER — NORETHINDRONE ACET-ETHINYL EST 1-20 MG-MCG PO TABS: 1.0000 | ORAL_TABLET | Freq: Every day | ORAL | 3 refills | Status: DC

## 2016-10-30 NOTE — Telephone Encounter (Signed)
Will refill junel

## 2016-10-30 NOTE — Telephone Encounter (Signed)
Pt called stating that her ins is requiring 90 day refills on  JUNEL 1/20 1-20 MG-MCG tablet   Pt is needing these called in to [facility]

## 2016-10-30 NOTE — Telephone Encounter (Signed)
Med prescribed by Dr. Despina HiddenEure. Pt notified to call his office for refills

## 2016-11-20 ENCOUNTER — Other Ambulatory Visit: Payer: Self-pay | Admitting: *Deleted

## 2016-11-20 MED ORDER — PANTOPRAZOLE SODIUM 40 MG PO TBEC: 40.0000 mg | DELAYED_RELEASE_TABLET | Freq: Every day | ORAL | 0 refills | Status: DC

## 2017-02-02 ENCOUNTER — Other Ambulatory Visit: Payer: Self-pay | Admitting: Nurse Practitioner

## 2017-02-04 MED ORDER — ONDANSETRON 8 MG PO TBDP: 8.0000 mg | ORAL_TABLET | Freq: Three times a day (TID) | ORAL | 0 refills | Status: DC | PRN

## 2017-02-11 ENCOUNTER — Other Ambulatory Visit: Payer: Self-pay | Admitting: *Deleted

## 2017-02-11 MED ORDER — PANTOPRAZOLE SODIUM 40 MG PO TBEC: 40.0000 mg | DELAYED_RELEASE_TABLET | Freq: Every day | ORAL | 0 refills | Status: DC

## 2017-04-25 ENCOUNTER — Encounter: Payer: Self-pay | Admitting: Family Medicine

## 2017-04-25 ENCOUNTER — Ambulatory Visit: Payer: BLUE CROSS/BLUE SHIELD | Admitting: Family Medicine

## 2017-04-25 VITALS — BP 112/74 | Temp 99.1°F | Ht 62.0 in | Wt 191.0 lb

## 2017-04-25 DIAGNOSIS — J019 Acute sinusitis, unspecified: Secondary | ICD-10-CM | POA: Diagnosis not present

## 2017-04-25 DIAGNOSIS — B9689 Other specified bacterial agents as the cause of diseases classified elsewhere: Secondary | ICD-10-CM | POA: Diagnosis not present

## 2017-04-25 MED ORDER — AMOXICILLIN-POT CLAVULANATE 875-125 MG PO TABS: 1.0000 | ORAL_TABLET | Freq: Two times a day (BID) | ORAL | 0 refills | Status: DC

## 2017-04-25 NOTE — Progress Notes (Signed)
   Subjective:    Patient ID: Karen Suarez, female    DOB: [birth year], 22 y.o.   MRN: [id number]  Sinusitis  This is a new problem. Episode onset: 4 days. Associated symptoms include chills, congestion, coughing, ear pain, headaches and a sore throat. Pertinent negatives include no shortness of breath. (Low grade fever) Treatments tried: alka seltzer, claritin d.   Patient with head congestion drainage sinus pressure denies wheezing difficulty breathing denies vomiting diarrhea   Review of Systems  Constitutional: Positive for chills. Negative for activity change and fever.  HENT: Positive for congestion, ear pain, rhinorrhea and sore throat.   Eyes: Negative for discharge.  Respiratory: Positive for cough. Negative for shortness of breath and wheezing.   Cardiovascular: Negative for chest pain.  Neurological: Positive for headaches.       Objective:   Physical Exam  Constitutional: She appears well-developed.  HENT:  Head: Normocephalic.  Right Ear: External ear normal.  Left Ear: External ear normal.  Nose: Nose normal.  Mouth/Throat: Oropharynx is clear and moist. No oropharyngeal exudate.  Eyes: Right eye exhibits no discharge. Left eye exhibits no discharge.  Neck: Neck supple. No tracheal deviation present.  Cardiovascular: Normal rate and normal heart sounds.  No murmur heard. Pulmonary/Chest: Effort normal and breath sounds normal. She has no wheezes. She has no rales.  Lymphadenopathy:    She has no cervical adenopathy.  Skin: Skin is warm and dry.  Nursing note and vitals reviewed.         Assessment & Plan:  Patient was seen today for upper respiratory illness. It is felt that the patient is dealing with sinusitis. Antibiotics were prescribed today. Importance of compliance with medication was discussed. Symptoms should gradually resolve over the course of the next several days. If high fevers, progressive illness, difficulty breathing, worsening condition  or failure for symptoms to improve over the next several days then the patient is to follow-up. If any emergent conditions the patient is to follow-up in the emergency department otherwise to follow-up in the office. Patient not toxic

## 2017-05-05 ENCOUNTER — Other Ambulatory Visit: Payer: Self-pay | Admitting: Family Medicine

## 2017-05-06 ENCOUNTER — Other Ambulatory Visit: Payer: Self-pay | Admitting: *Deleted

## 2017-05-06 ENCOUNTER — Encounter: Payer: Self-pay | Admitting: *Deleted

## 2017-05-06 ENCOUNTER — Telehealth: Payer: Self-pay | Admitting: *Deleted

## 2017-05-06 ENCOUNTER — Encounter: Payer: Self-pay | Admitting: Family Medicine

## 2017-05-06 MED ORDER — AMOXICILLIN-POT CLAVULANATE 875-125 MG PO TABS: 1.0000 | ORAL_TABLET | Freq: Two times a day (BID) | ORAL | 0 refills | Status: DC

## 2017-05-06 NOTE — Telephone Encounter (Signed)
Please go ahead and refill Augmentin-follow-up if ongoing troubles

## 2017-05-06 NOTE — Telephone Encounter (Signed)
Med sent to pharm and mychart message sent to pt letting her know it was sent.

## 2017-05-06 NOTE — Telephone Encounter (Signed)
Seen 3/7. Still having sinus drainage - yellow, sinus pressure and headache. No fever, a little sob from where nose is stopped up. Finished augmentin last [facility]. [facility]. Pt will be at work and would like a Clinical cytogeneticistmychart message sent to her.   I tried to call you to get more information for the doctor. Please call the office at [phone]. Thank you.  ===View-only below this line===   ----- Message -----    From: Jeanine Luzandace Sandell    Sent: 05/06/2017 12:54 PM EDT      To: Lilyan PuntScott Luking, MD Subject: Visit Follow-Up Question  Hi, I am still not feeling 100% better I put in a request to refill the augmentin and it was denied, can I get a refill please

## 2017-08-08 ENCOUNTER — Other Ambulatory Visit: Payer: Self-pay | Admitting: Nurse Practitioner

## 2017-08-08 MED ORDER — ONDANSETRON 8 MG PO TBDP: 8.0000 mg | ORAL_TABLET | Freq: Three times a day (TID) | ORAL | 0 refills | Status: DC | PRN

## 2017-08-08 MED ORDER — NAPROXEN 500 MG PO TABS: ORAL_TABLET | ORAL | 0 refills | Status: DC

## 2017-08-08 MED ORDER — RIZATRIPTAN BENZOATE 10 MG PO TBDP
10.0000 mg | ORAL_TABLET | ORAL | 0 refills | Status: DC | PRN
Start: 2017-08-08 — End: 2018-06-26

## 2017-08-30 ENCOUNTER — Telehealth: Payer: Self-pay | Admitting: Obstetrics & Gynecology

## 2017-08-30 MED ORDER — NORETHINDRONE ACET-ETHINYL EST 1-20 MG-MCG PO TABS: 1.0000 | ORAL_TABLET | Freq: Every day | ORAL | 3 refills | Status: DC

## 2017-08-30 NOTE — Telephone Encounter (Signed)
Refilled junel  

## 2017-08-30 NOTE — Telephone Encounter (Signed)
Patient called requesting a refill on Junel.  She uses [facility].  [phone]

## 2017-09-25 ENCOUNTER — Ambulatory Visit: Payer: BLUE CROSS/BLUE SHIELD | Admitting: Adult Health

## 2017-09-25 ENCOUNTER — Encounter: Payer: Self-pay | Admitting: Adult Health

## 2017-09-25 VITALS — BP 124/81 | HR 80 | Ht 61.0 in | Wt 199.3 lb

## 2017-09-25 DIAGNOSIS — Z3041 Encounter for surveillance of contraceptive pills: Secondary | ICD-10-CM

## 2017-09-25 DIAGNOSIS — Z01419 Encounter for gynecological examination (general) (routine) without abnormal findings: Secondary | ICD-10-CM | POA: Insufficient documentation

## 2017-09-25 DIAGNOSIS — L918 Other hypertrophic disorders of the skin: Secondary | ICD-10-CM | POA: Insufficient documentation

## 2017-09-25 DIAGNOSIS — R635 Abnormal weight gain: Secondary | ICD-10-CM

## 2017-09-25 MED ORDER — NORETHINDRONE ACET-ETHINYL EST 1-20 MG-MCG PO TABS: 1.0000 | ORAL_TABLET | Freq: Every day | ORAL | 3 refills | Status: DC

## 2017-09-25 NOTE — Progress Notes (Signed)
Patient ID: Karen Suarez, female   DOB: 03-Nov-1995, 22 y.o.   MRN: [id number] History of Present Illness: Karen MastersCandace is a 22 year old white female, married in for well woman gyn exam,she had a normal pap with negative HPV 09/18/16.  PCP is Dr Karen Suarez.    Current Medications, Allergies, Past Medical History, Past Surgical History, Family History and Social History were reviewed in [facility] Link electronic medical record.     Review of Systems: Patient denies any headaches, hearing loss, fatigue, blurred vision, shortness of breath, chest pain, abdominal pain, problems with bowel movements, urination, or intercourse. No joint pain or mood swings. +weight gain   Physical Exam:BP 124/81 (BP Location: Left Arm, Patient Position: Sitting, Cuff Size: Normal)   Pulse 80   Ht 5\' 1"  (1.549 m)   Wt 199 lb 4.8 oz (90.4 kg)   BMI 37.66 kg/m  General:  Well developed, well nourished, no acute distress Skin:  Warm and dry Neck:  Midline trachea, normal thyroid, good ROM, no lymphadenopathy Lungs; Clear to auscultation bilaterally Breast:  No dominant palpable mass, retraction, or nipple discharge Cardiovascular: Regular rate and rhythm Abdomen:  Soft, non tender, no hepatosplenomegaly Pelvic:  External genitalia is normal in appearance, no lesions.  The vagina is normal in appearance. Urethra has no lesions or masses. The cervix is smooth.  Uterus is felt to be normal size, shape, and contour.  No adnexal masses or tenderness noted.Bladder is non tender, no masses felt. Extremities/musculoskeletal:  No swelling or varicosities noted, no clubbing or cyanosis Psych:  No mood changes, alert and cooperative,seems happy PHQ 9 score is 10,denies being suicidal but feels down on self at times, declines meds at present, try taking 30 minutes every day for self, walk, read, get massage. Has gained 11 lbs in last year. She may want to go off OCs next year.  Impression: 1. Encounter for well woman exam  with routine gynecological exam   2. Encounter for surveillance of contraceptive pills   3. Weight gain   4. Skin tag       Plan:  Check CBC,CMP,TSH  Meds ordered this encounter  Medications  . norethindrone-ethinyl estradiol (JUNEL 1/20) 1-20 MG-MCG tablet    Sig: Take 1 tablet by mouth daily.    Dispense:  90 tablet    Refill:  3    Order Specific Question:   Supervising Provider    Answer:   Karen Suarez [[id number]]  Physical in 1 year Pap in 2021

## 2017-09-26 LAB — COMPREHENSIVE METABOLIC PANEL
A/G RATIO: 1.6 (ref 1.2–2.2)
ALT: 22 IU/L (ref 0–32)
AST: 20 IU/L (ref 0–40)
Albumin: 4.4 g/dL (ref 3.5–5.5)
Alkaline Phosphatase: 102 IU/L (ref 39–117)
BUN/Creatinine Ratio: 9 (ref 9–23)
BUN: 6 mg/dL (ref 6–20)
CHLORIDE: 101 mmol/L (ref 96–106)
CO2: 22 mmol/L (ref 20–29)
Calcium: 9.5 mg/dL (ref 8.7–10.2)
Creatinine, Ser: 0.68 mg/dL (ref 0.57–1.00)
GFR calc non Af Amer: 124 mL/min/{1.73_m2} (ref >59)
GFR, EST AFRICAN AMERICAN: 143 mL/min/{1.73_m2} (ref >59)
GLOBULIN, TOTAL: 2.8 g/dL (ref 1.5–4.5)
Glucose: 94 mg/dL (ref 65–99)
Potassium: 4.5 mmol/L (ref 3.5–5.2)
SODIUM: 141 mmol/L (ref 134–144)
TOTAL PROTEIN: 7.2 g/dL (ref 6.0–8.5)

## 2017-09-26 LAB — CBC
Hematocrit: 41.3 % (ref 34.0–46.6)
Hemoglobin: 13.5 g/dL (ref 11.1–15.9)
MCH: 28.3 pg (ref 26.6–33.0)
MCHC: 32.7 g/dL (ref 31.5–35.7)
MCV: 87 fL (ref 79–97)
PLATELETS: 429 10*3/uL (ref 150–450)
RBC: 4.77 x10E6/uL (ref 3.77–5.28)
RDW: 13.6 % (ref 12.3–15.4)
WBC: 12.3 10*3/uL — ABNORMAL HIGH (ref 3.4–10.8)

## 2017-09-26 LAB — TSH: TSH: 3.12 u[IU]/mL (ref 0.450–4.500)

## 2017-09-27 ENCOUNTER — Other Ambulatory Visit: Payer: BLUE CROSS/BLUE SHIELD | Admitting: Adult Health

## 2017-10-04 ENCOUNTER — Other Ambulatory Visit: Payer: BLUE CROSS/BLUE SHIELD | Admitting: Adult Health

## 2017-10-25 ENCOUNTER — Telehealth: Payer: Self-pay | Admitting: Adult Health

## 2017-10-25 MED ORDER — PNV PRENATAL PLUS MULTIVIT+DHA 27-1 & 312 MG PO MISC: 1.0000 | Freq: Every day | ORAL | 12 refills | Status: DC

## 2017-10-25 NOTE — Telephone Encounter (Signed)
Patient called, stated that she was seen about a month ago and that she was told when she was ready to stop her birth control that we'd call her in some prenatal vitamins.  She is ready for these.  [facility]  [phone]

## 2017-10-25 NOTE — Telephone Encounter (Signed)
RX sent for PNV

## 2017-11-15 ENCOUNTER — Encounter: Payer: Self-pay | Admitting: Family Medicine

## 2017-11-15 ENCOUNTER — Ambulatory Visit: Payer: BLUE CROSS/BLUE SHIELD | Admitting: Family Medicine

## 2017-11-15 ENCOUNTER — Telehealth: Payer: Self-pay | Admitting: Family Medicine

## 2017-11-15 VITALS — BP 122/86 | Temp 97.6°F | Ht 61.0 in | Wt 197.8 lb

## 2017-11-15 DIAGNOSIS — J019 Acute sinusitis, unspecified: Secondary | ICD-10-CM | POA: Diagnosis not present

## 2017-11-15 MED ORDER — CEFPROZIL 500 MG PO TABS: 500.0000 mg | ORAL_TABLET | Freq: Two times a day (BID) | ORAL | 0 refills | Status: DC

## 2017-11-15 MED ORDER — LEVOFLOXACIN 500 MG PO TABS: 500.0000 mg | ORAL_TABLET | Freq: Every day | ORAL | 0 refills | Status: DC

## 2017-11-15 NOTE — Progress Notes (Signed)
   Subjective:    Patient ID: Karen Suarez, female    DOB: [birth year], 22 y.o.   MRN: [id number]  Sinus Problem  This is a new problem. The current episode started 1 to 4 weeks ago. Associated symptoms include congestion, coughing, headaches, sinus pressure and a sore throat. Pertinent negatives include no ear pain or shortness of breath. Treatments tried: augmentin via urgent care, delsym.   Patient states she could be pregnant and wants [place] to know for medication purposes Patient is trying to get pregnant  Review of Systems  Constitutional: Negative for activity change and fever.  HENT: Positive for congestion, rhinorrhea, sinus pressure and sore throat. Negative for ear pain.   Eyes: Negative for discharge.  Respiratory: Positive for cough. Negative for shortness of breath and wheezing.   Cardiovascular: Negative for chest pain.  Neurological: Positive for headaches.       Objective:   Physical Exam  Constitutional: She appears well-developed.  HENT:  Head: Normocephalic.  Nose: Nose normal.  Mouth/Throat: Oropharynx is clear and moist. No oropharyngeal exudate.  Neck: Neck supple.  Cardiovascular: Normal rate and normal heart sounds.  No murmur heard. Pulmonary/Chest: Effort normal and breath sounds normal. She has no wheezes.  Lymphadenopathy:    She has no cervical adenopathy.  Skin: Skin is warm and dry.  Nursing note and vitals reviewed.         Assessment & Plan:  Viral syndrome Secondary rhinosinusitis Cefzil 10 days as directed Initially Levaquin was put in but obviously with patient wanting to get pregnant I would not recommend that If patient has progressive troubles or worse follow-up

## 2017-11-15 NOTE — Telephone Encounter (Signed)
Pt went to urgent care Labor Day weekend. She was given an antibiotic then for her sinus issues. She has never really gotten better from that visit. She is having a really bad cough and her nose is stopped up. She is wanting to know if something can be called in or if she should go back to urgent care due to leaving work early today. If able to send something in please send to [id number] - [place], [place] - [address]

## 2017-11-15 NOTE — Telephone Encounter (Signed)
Please advise 

## 2017-11-15 NOTE — Telephone Encounter (Signed)
NTBS either here at 4 pm or urgent care

## 2017-11-15 NOTE — Telephone Encounter (Signed)
Pt contacted and informed her that she needed to be seen here at 4 pm. Pt verbalized understanding.

## 2017-11-25 ENCOUNTER — Ambulatory Visit: Payer: BLUE CROSS/BLUE SHIELD | Admitting: Adult Health

## 2017-12-02 ENCOUNTER — Telehealth: Payer: Self-pay | Admitting: *Deleted

## 2017-12-03 ENCOUNTER — Encounter: Payer: Self-pay | Admitting: Adult Health

## 2017-12-03 ENCOUNTER — Ambulatory Visit: Payer: BLUE CROSS/BLUE SHIELD | Admitting: Adult Health

## 2017-12-03 ENCOUNTER — Other Ambulatory Visit: Payer: Self-pay | Admitting: Adult Health

## 2017-12-03 ENCOUNTER — Telehealth: Payer: Self-pay | Admitting: Adult Health

## 2017-12-03 VITALS — BP 126/82 | HR 105 | Ht 61.0 in | Wt 202.0 lb

## 2017-12-03 DIAGNOSIS — L918 Other hypertrophic disorders of the skin: Secondary | ICD-10-CM

## 2017-12-03 MED ORDER — PRENATE MINI 18-0.6-0.4-350 MG PO CAPS: 1.0000 | ORAL_CAPSULE | Freq: Every day | ORAL | 12 refills | Status: DC

## 2017-12-03 NOTE — Telephone Encounter (Signed)
Patient called stating that Karen Suarez send a prenatal to her pharmacy and it is costing her $240 dollars pt states that she would like Karen Suarez to call her in something else or if she could get it over the counter. Please contact pt

## 2017-12-03 NOTE — Telephone Encounter (Signed)
rx prenate mini

## 2017-12-03 NOTE — Patient Instructions (Signed)
Keep clean and dry  Can use ice cube for 10 minutes if ooze

## 2017-12-03 NOTE — Telephone Encounter (Signed)
Spoke with pt. Pt was prescribed a prenatal vit and it's to expensive. I advised can get samples of a prenatal vit. She has an appt here today so will get samples then. JSY

## 2017-12-03 NOTE — Addendum Note (Signed)
Addended by: Colen Darling on: 12/03/2017 05:15 PM   Modules accepted: Orders

## 2017-12-03 NOTE — Progress Notes (Signed)
  Subjective:     Patient ID: Karen Suarez, female   DOB: 11/14/1995, 22 y.o.   MRN: [id number]  HPI Karen Suarez is a 22 year old white female in for skin tag removal in pantie line area.   Review of Systems For skin tag removal Reviewed past medical,surgical, social and family history. Reviewed medications and allergies.     Objective:   Physical Exam BP 126/82 (BP Location: Left Arm, Patient Position: Sitting, Cuff Size: Large)   Pulse (!) 105   Ht 5\' 1"  (1.549 m)   Wt 202 lb (91.6 kg)   LMP 11/07/2017   BMI 38.17 kg/m   Consent signed, time out called. Skin warm and dry,cleansed skin tag with alcohol and then,  Injected skin tag right buttock with 1.5 cc 1 % lidocaine and waited til numb, and then grasped stalk with hemostats and then removed and excised with scissors, had 1 drop of blood and silver nitrated applied. 4 mm skin tag placed in formalin pot. Then repeated as above on skin tag right inner thigh near pantie line This one was about 3 mm in size.covered with sterile 4 x 4, til gets home.     Assessment:     Skin tags, removed    Plan:     2 skin tags to pathology  Keep clean and dry Can use ice cube for 10 minutes if oozes F/U prn

## 2017-12-09 ENCOUNTER — Telehealth: Payer: Self-pay | Admitting: Adult Health

## 2017-12-09 NOTE — Telephone Encounter (Signed)
Pt aware that derm path, OK

## 2017-12-23 ENCOUNTER — Telehealth: Payer: Self-pay | Admitting: Adult Health

## 2017-12-23 ENCOUNTER — Telehealth: Payer: Self-pay | Admitting: Nurse Practitioner

## 2017-12-23 NOTE — Telephone Encounter (Signed)
Spoke with pt advising she needs an appt per JAG. Pt to schedule an appt. JSY

## 2017-12-23 NOTE — Telephone Encounter (Signed)
Patient called stating that she seen Victorino Dike on 12/03/17 and she thinks she has a UTI. Pt wants to know if she could drop off urine and Jennifer could dip it and call her something in. Please contact pt

## 2017-12-23 NOTE — Telephone Encounter (Signed)
Patient called, stated that Karen Suarez wanted her to call and schedule an appointment.  I offered her an appointment for tomorrow, 12/24/17 in the afternoon and she stated that she needed an appointment after 4:30pm.  She stated that she will have to go to Urgent Care.

## 2018-02-24 ENCOUNTER — Encounter: Payer: Self-pay | Admitting: Adult Health

## 2018-02-24 ENCOUNTER — Ambulatory Visit: Payer: BLUE CROSS/BLUE SHIELD | Admitting: Adult Health

## 2018-02-24 VITALS — BP 108/69 | HR 89 | Ht 61.0 in | Wt 202.3 lb

## 2018-02-24 DIAGNOSIS — N926 Irregular menstruation, unspecified: Secondary | ICD-10-CM | POA: Diagnosis not present

## 2018-02-24 DIAGNOSIS — Z3202 Encounter for pregnancy test, result negative: Secondary | ICD-10-CM

## 2018-02-24 LAB — POCT URINE PREGNANCY: Preg Test, Ur: NEGATIVE

## 2018-02-24 MED ORDER — MEDROXYPROGESTERONE ACETATE 10 MG PO TABS: 10.0000 mg | ORAL_TABLET | Freq: Every day | ORAL | 0 refills | Status: DC

## 2018-02-24 NOTE — Patient Instructions (Signed)
CLOMID INSTRUCTIONS  WHY USE IT? Clomid helps your ovaries to release eggs (ovulate).  HOW TO USE IT? Clomid is taken as a pill usually on days 5,6,7,8, & 9 of your cycle.  Day 1 is the first day of your period. The dose or duration may be changed to achieve ovulation.  Provera (progesterone) may first be used to bring on a period for some patients. The day of ovulation on Clomid is usually between cycle day 14 and 17.  Having sexual intercourse at least every other day between cycle day 13 and 18 will improve your chances of becoming pregnant during the Clomid cycle.  You may monitor your ovulation using basal body temperature charts or with ovulation kits.  If using the ovulation predictor kits, having intercourse the day of the surge and the two days following is recommended. If you get your period, call when it starts for an appointment with your doctor, so that an exam may be done, and another Clomid cycle can be considered if appropriate. If you do not get a period by day 35 of the cycle, please get a blood pregnancy test.  If it is negative, speak to your doctor for instructions to bring on another period and to plan a follow-up appointment.  THINGS TO KNOW: If you get pregnant while using Clomid, your chance of twins is 7%m and triplets is less than 1%. Some studies have suggested the use of "fertility drugs" may increase your risk of ovarian cancers in the future.  It is unclear if these drugs increase the risk, or people who have problems with fertility are prone for these cancers.  If there is an actual risk, it is very low.  If you have a history of liver problems or ovarian cancer, it may be wise to avoid this medication.  SIDE EFFECTS: The most common side effect is hot flashes (20%). Breast tenderness, headaches, nausea, bloating may also occur at different times. Less than 3/1,000 people have dryness or loss of hair. Persistent ovarian cysts may form from the use of this  medication. Ovarian hyperstimulation syndrome is a rare side effect at low doses. Visual changes like flashes of light or blurring.   

## 2018-02-24 NOTE — Progress Notes (Signed)
Patient ID: Karen LuzCandace Suarez, female   DOB: 08/23/1995, 23 y.o.   MRN: [id number] History of Present Illness: Karen MastersCandace is a 23 year old white female, in complaining of no period since September, stopped OCs in September, has had negative HPTs. Would like to get pregnant. PCP is DTE Energy CompanyScott Luking.    Current Medications, Allergies, Past Medical History, Past Surgical History, Family History and Social History were reviewed in [facility] Link electronic medical record.     Review of Systems: No period since September Stopped OCs in September  Would like to get pregnant    Physical Exam:BP 108/69 (BP Location: Left Arm, Patient Position: Sitting, Cuff Size: Large)   Pulse 89   Ht 5\' 1"  (1.549 m)   Wt 202 lb 4.8 oz (91.8 kg)   LMP 11/09/2017   BMI 38.22 kg/m   UPT is negative. General:  Well developed, well nourished, no acute distress Skin:  Warm and dry Lungs; Clear to auscultation bilaterally Cardiovascular: Regular rate and rhythm  Psych:  No mood changes, alert and cooperative,seems happy Fall risk is low. Will give provera 10 mg 1 daily for 14 days to see if can get withdrawal bleed, discussed not ovulating.  Impression: 1. Missed periods   2. Pregnancy test negative       Plan: Meds ordered this encounter  Medications  . medroxyPROGESTERone (PROVERA) 10 MG tablet    Sig: Take 1 tablet (10 mg total) by mouth daily.    Dispense:  14 tablet    Refill:  0    Order Specific Question:   Supervising Provider    Answer:   Duane LopeEURE, LUTHER H [[id number]]  F/U in 3 weeks to see if starts or now Review handout on clomid, as option to get to ovulate

## 2018-03-18 ENCOUNTER — Ambulatory Visit (INDEPENDENT_AMBULATORY_CARE_PROVIDER_SITE_OTHER): Payer: BLUE CROSS/BLUE SHIELD | Admitting: Adult Health

## 2018-03-18 ENCOUNTER — Encounter: Payer: Self-pay | Admitting: Adult Health

## 2018-03-18 VITALS — BP 136/86 | HR 108 | Ht 61.0 in | Wt 204.0 lb

## 2018-03-18 DIAGNOSIS — Z319 Encounter for procreative management, unspecified: Secondary | ICD-10-CM | POA: Insufficient documentation

## 2018-03-18 DIAGNOSIS — Z3202 Encounter for pregnancy test, result negative: Secondary | ICD-10-CM

## 2018-03-18 LAB — POCT URINE PREGNANCY: PREG TEST UR: NEGATIVE

## 2018-03-18 NOTE — Progress Notes (Signed)
Patient ID: Karen Suarez, female   DOB: 08/26/1995, 23 y.o.   MRN: [id number] History of Present Illness: [name] is a 23 year old white female back in follow up on taking provera to start a period, not not had one since September 21,2019, after stopping OCs.She would like to get pregnant. PCP is [facility]   Current Medications, Allergies, Past Medical History, Past Surgical History, Family History and Social History were reviewed in [employer] record.     Review of Systems: Took provera and period started 1/22    Physical Exam:BP 136/86 (BP Location: Left Arm, Patient Position: Sitting, Cuff Size: Normal)   Pulse (!) 108   Ht 5\' 1"  (1.549 m)   Wt 204 lb (92.5 kg)   LMP 03/12/2018   BMI 38.55 kg/m   UPT negative. General:  Well developed, well nourished, no acute distress Skin:  Warm and dry Psych:  No mood changes, alert and cooperative,seems happy She would like to try clomid, with next cycle.    Impression: 1. Patient desires pregnancy   2. Pregnancy test negative       Plan: Call me with next period,or not,  will try clomid to induce ovulation Take PNV F/U prn

## 2018-04-21 ENCOUNTER — Other Ambulatory Visit: Payer: Self-pay | Admitting: Adult Health

## 2018-04-21 DIAGNOSIS — Z319 Encounter for procreative management, unspecified: Secondary | ICD-10-CM

## 2018-04-21 MED ORDER — CLOMIPHENE CITRATE 50 MG PO TABS: ORAL_TABLET | ORAL | 2 refills | Status: DC

## 2018-04-21 NOTE — Progress Notes (Signed)
rx clomid 

## 2018-04-21 NOTE — Progress Notes (Signed)
Ck progesterone 3/23

## 2018-04-22 ENCOUNTER — Other Ambulatory Visit: Payer: BLUE CROSS/BLUE SHIELD

## 2018-04-23 NOTE — Telephone Encounter (Signed)
Note sent to nurse. 

## 2018-05-12 ENCOUNTER — Other Ambulatory Visit: Payer: Self-pay

## 2018-05-12 ENCOUNTER — Other Ambulatory Visit: Payer: BLUE CROSS/BLUE SHIELD

## 2018-05-13 LAB — PROGESTERONE: Progesterone: 13.9 ng/mL

## 2018-05-21 ENCOUNTER — Encounter: Payer: Self-pay | Admitting: *Deleted

## 2018-05-22 ENCOUNTER — Encounter: Payer: Self-pay | Admitting: Adult Health

## 2018-05-22 ENCOUNTER — Other Ambulatory Visit: Payer: Self-pay | Admitting: Adult Health

## 2018-05-22 ENCOUNTER — Ambulatory Visit (HOSPITAL_COMMUNITY)
Admission: RE | Admit: 2018-05-22 | Discharge: 2018-05-22 | Disposition: A | Discharge status: Home / Self Care | Payer: BLUE CROSS/BLUE SHIELD | Source: Ambulatory Visit | Attending: Adult Health | Admitting: Adult Health

## 2018-05-22 ENCOUNTER — Other Ambulatory Visit: Payer: Self-pay

## 2018-05-22 ENCOUNTER — Ambulatory Visit (INDEPENDENT_AMBULATORY_CARE_PROVIDER_SITE_OTHER): Payer: BLUE CROSS/BLUE SHIELD | Admitting: Adult Health

## 2018-05-22 ENCOUNTER — Telehealth: Payer: Self-pay | Admitting: Adult Health

## 2018-05-22 VITALS — Ht 61.0 in | Wt 200.0 lb

## 2018-05-22 DIAGNOSIS — O3680X0 Pregnancy with inconclusive fetal viability, not applicable or unspecified: Secondary | ICD-10-CM | POA: Diagnosis not present

## 2018-05-22 DIAGNOSIS — M79604 Pain in right leg: Secondary | ICD-10-CM | POA: Insufficient documentation

## 2018-05-22 DIAGNOSIS — Z3201 Encounter for pregnancy test, result positive: Secondary | ICD-10-CM | POA: Insufficient documentation

## 2018-05-22 DIAGNOSIS — Z3490 Encounter for supervision of normal pregnancy, unspecified, unspecified trimester: Secondary | ICD-10-CM | POA: Insufficient documentation

## 2018-05-22 NOTE — Telephone Encounter (Signed)
Pt aware that Korea was negative for DVT, try ice elevation and tylenol

## 2018-05-22 NOTE — Progress Notes (Signed)
Patient ID: Karen Suarez, female   DOB: 1995-03-19, 23 y.o.   MRN: [id number]   TELEHEALTH VIRTUAL GYNECOLOGY VISIT ENCOUNTER NOTE  I connected with Karen Suarez on 05/22/18 at  8:45 AM EDT by telephone at home and verified that I am speaking with the correct person using two identifiers.  WEBEX visit. I discussed the limitations, risks, security and privacy concerns of performing an evaluation and management service by telephone and the availability of in person appointments. I also discussed with the patient that there may be a patient responsible charge related to this service. The patient expressed understanding and agreed to proceed.   History:  Karen Suarez is a 23 y.o. G67P0000 female being evaluated today for having had 2 +HPTs. Her last period was in January after provera and she has taken clomid and had first +HPT Sunday . She is about 4+[redacted] weeks pregnant, by when she took clomid, with EDD 01/24/2019. She denies any abnormal vaginal discharge, bleeding, pelvic pain. She has had breast tenderness,urinary frequency,+tired, and +nasuea and is moody.For last 3 days has had pain in right leg and it looks slightly swollen to her, and she can't feel her pulses, but leg feels warm and has no hot spots.      Past Medical History:  Diagnosis Date   Family history of adverse reaction to anesthesia    pt's father has hx. of being hard to wake up post-op   Tonsillar and adenoid hypertrophy 01/2015   Past Surgical History:  Procedure Laterality Date   NO PAST SURGERIES     TONSILLECTOMY AND ADENOIDECTOMY N/A 01/31/2015   Procedure: TONSILLECTOMY AND ADENOIDECTOMY;  Surgeon: Newman Pies, MD;  Location: [facility];  Service: ENT;  Laterality: N/A;   The following portions of the patient's history were reviewed and updated as appropriate: allergies, current medications, past family history, past medical history, past social history, past surgical history and problem list.    Health Maintenance:  Normal pap, with negative HPV 09/18/16.  Review of Systems:  Pertinent items noted in HPI and remainder of comprehensive ROS otherwise negative.  Physical Exam:  Physical exam deferred due to nature of the encounter  Labs and Imaging Results for orders placed or performed in visit on 04/21/18 (from the past 336 hour(s))  Progesterone   Collection Time: 05/12/18  8:05 AM  Result Value Ref Range   Progesterone 13.9 ng/mL   US Venous Img Lower Unilateral Right  Result Date: 05/22/2018 CLINICAL DATA:  Right lower extremity pain for the past 3 days. Patient is currently pregnant. Evaluate for DVT. EXAM: RIGHT LOWER EXTREMITY VENOUS DOPPLER ULTRASOUND TECHNIQUE: Gray-scale sonography with graded compression, as well as color Doppler and duplex ultrasound were performed to evaluate the lower extremity deep venous systems from the level of the common femoral vein and including the common femoral, femoral, profunda femoral, popliteal and calf veins including the posterior tibial, peroneal and gastrocnemius veins when visible. The superficial great saphenous vein was also interrogated. Spectral Doppler was utilized to evaluate flow at rest and with distal augmentation maneuvers in the common femoral, femoral and popliteal veins. COMPARISON:  None. FINDINGS: Contralateral Common Femoral Vein: Respiratory phasicity is normal and symmetric with the symptomatic side. No evidence of thrombus. Normal compressibility. Common Femoral Vein: No evidence of thrombus. Normal compressibility, respiratory phasicity and response to augmentation. Saphenofemoral Junction: No evidence of thrombus. Normal compressibility and flow on color Doppler imaging. Profunda Femoral Vein: No evidence of thrombus. Normal compressibility and flow on color  Doppler imaging. Femoral Vein: No evidence of thrombus. Normal compressibility, respiratory phasicity and response to augmentation. Popliteal Vein: No evidence of  thrombus. Normal compressibility, respiratory phasicity and response to augmentation. Calf Veins: No evidence of thrombus. Normal compressibility and flow on color Doppler imaging. Superficial Great Saphenous Vein: No evidence of thrombus. Normal compressibility. Venous Reflux:  None. Other Findings:  None. IMPRESSION: No evidence of DVT within the right lower extremity. Electronically Signed   By: Simonne Come M.D.   On: 05/22/2018 11:38      Assessment and Plan:     1. Positive pregnancy test  2. Pregnancy, unspecified gestational age - Beta hCG quant (ref lab) - Progesterone  3. Pain of right lower extremity    Today at [facility] - US Venous Img Lower Unilateral Right; Future  4. Encounter to determine fetal viability of pregnancy, single or unspecified fetus - US OB Comp AddL Gest Less 14 Wks; Future       I discussed the assessment and treatment plan with the patient. The patient was provided an opportunity to ask questions and all were answered. The patient agreed with the plan and demonstrated an understanding of the instructions.   The patient was advised to call back or seek an in-person evaluation/go to the ED if the symptoms worsen or if the condition fails to improve as anticipated.  I provided 11 minutes of non-face-to-face time during this encounter.   Cyril Mourning, NP [facility] [employer], [facility]

## 2018-05-22 NOTE — Telephone Encounter (Signed)
Patient requesting a call for the Korea results that was done at [facility] for her leg pain. States the pain is bad that she is unable to work. Please advise.

## 2018-05-23 ENCOUNTER — Telehealth: Payer: Self-pay | Admitting: Family Medicine

## 2018-05-23 ENCOUNTER — Telehealth: Payer: Self-pay | Admitting: Adult Health

## 2018-05-23 LAB — BETA HCG QUANT (REF LAB): hCG Quant: 821 m[IU]/mL

## 2018-05-23 LAB — PROGESTERONE: Progesterone: 9.3 ng/mL

## 2018-05-23 MED ORDER — PROGESTERONE MICRONIZED 200 MG PO CAPS: ORAL_CAPSULE | ORAL | 4 refills | Status: DC

## 2018-05-23 MED ORDER — TIZANIDINE HCL 4 MG PO CAPS: 4.0000 mg | ORAL_CAPSULE | Freq: Three times a day (TID) | ORAL | 0 refills | Status: DC | PRN

## 2018-05-23 NOTE — Telephone Encounter (Signed)
Pt aware of labs, since progesterone 9.3 will rx Prometrium at bedtime, has had some nausea, eat often can use dramamine

## 2018-05-23 NOTE — Telephone Encounter (Signed)
Prescription sent electronically to pharmacy. Patient notified. 

## 2018-05-23 NOTE — Telephone Encounter (Signed)
Add zanaflex 4 tid numb 30 and stay on tylenol

## 2018-05-23 NOTE — Telephone Encounter (Signed)
Pt is calling complaining of right lower leg pain. She had a web visit with OB yesterday who ordered and ultrasound which came back negative. Pt found out yesterday she is pregnant and states tylenol is not helping for the pain she was also instructed to elevate and ice and that is not helping either. She states it is a little swollen but no redness. She wants to know if something could be called in for the pain. Her leg has been bothering her since Tuesday.   CB# [phone]

## 2018-06-12 ENCOUNTER — Ambulatory Visit (INDEPENDENT_AMBULATORY_CARE_PROVIDER_SITE_OTHER): Payer: BLUE CROSS/BLUE SHIELD

## 2018-06-12 ENCOUNTER — Other Ambulatory Visit: Payer: Self-pay

## 2018-06-12 ENCOUNTER — Other Ambulatory Visit: Payer: Self-pay | Admitting: Adult Health

## 2018-06-12 DIAGNOSIS — O3680X0 Pregnancy with inconclusive fetal viability, not applicable or unspecified: Secondary | ICD-10-CM

## 2018-06-12 DIAGNOSIS — Z3A01 Less than 8 weeks gestation of pregnancy: Secondary | ICD-10-CM | POA: Diagnosis not present

## 2018-06-12 NOTE — Progress Notes (Signed)
Korea TA/TV 6+1 wks,single IUP w/ys,positive fht 98 bpm,low lying GS best visualized on image 5,normal right ovary,simple left corpus luteal cyst 2.7 x 2.6 x 2.6 cm,pt will come back for f/u ultrasound in 10 days per Victorino Dike

## 2018-06-23 ENCOUNTER — Other Ambulatory Visit: Payer: Self-pay | Admitting: Adult Health

## 2018-06-23 DIAGNOSIS — O368390 Maternal care for abnormalities of the fetal heart rate or rhythm, unspecified trimester, not applicable or unspecified: Secondary | ICD-10-CM

## 2018-06-23 DIAGNOSIS — O3680X0 Pregnancy with inconclusive fetal viability, not applicable or unspecified: Secondary | ICD-10-CM

## 2018-06-25 ENCOUNTER — Other Ambulatory Visit: Payer: BLUE CROSS/BLUE SHIELD

## 2018-06-26 ENCOUNTER — Encounter: Payer: Self-pay | Admitting: Adult Health

## 2018-06-26 ENCOUNTER — Ambulatory Visit (INDEPENDENT_AMBULATORY_CARE_PROVIDER_SITE_OTHER): Payer: BLUE CROSS/BLUE SHIELD | Admitting: Adult Health

## 2018-06-26 ENCOUNTER — Ambulatory Visit (INDEPENDENT_AMBULATORY_CARE_PROVIDER_SITE_OTHER): Payer: BLUE CROSS/BLUE SHIELD

## 2018-06-26 ENCOUNTER — Other Ambulatory Visit: Payer: Self-pay

## 2018-06-26 ENCOUNTER — Other Ambulatory Visit: Payer: Self-pay | Admitting: Adult Health

## 2018-06-26 VITALS — BP 134/89 | HR 121 | Temp 98.7°F | Wt 209.5 lb

## 2018-06-26 DIAGNOSIS — O368390 Maternal care for abnormalities of the fetal heart rate or rhythm, unspecified trimester, not applicable or unspecified: Secondary | ICD-10-CM

## 2018-06-26 DIAGNOSIS — O039 Complete or unspecified spontaneous abortion without complication: Secondary | ICD-10-CM

## 2018-06-26 DIAGNOSIS — O3680X0 Pregnancy with inconclusive fetal viability, not applicable or unspecified: Secondary | ICD-10-CM

## 2018-06-26 DIAGNOSIS — Z3A08 8 weeks gestation of pregnancy: Secondary | ICD-10-CM | POA: Diagnosis not present

## 2018-06-26 NOTE — Progress Notes (Signed)
Patient ID: Karen Suarez, female   DOB: 07-13-1995, 23 y.o.   MRN: [id number] TV US: 5+6 wks,single low lying IUP,no fht,normal right ovary,simple left corpus luteal cyst 2.7 x 2.4 x 2.6 cm,crl 3.25 mm,Jennifer discussed results w/pt.

## 2018-06-26 NOTE — Progress Notes (Signed)
Patient ID: Karen Suarez, female   DOB: 1995-09-24, 23 y.o.   MRN: [id number] History of Present Illness: Karen Suarez is a 23 year old Karen female in for follow up US, her last Korea was 4/23 and fetal pole was 6+1 week and FHR was 98.   Current Medications, Allergies, Past Medical History, Past Surgical History, Family History and Social History were reviewed in [employer] record.     Review of Systems: No bleeding     Physical Exam:BP 134/89   Pulse (!) 121   Temp 98.7 F (37.1 C)   Wt 209 lb 8 oz (95 kg)   LMP 03/12/2018   BMI 39.58 kg/m  General:  Well developed, well nourished, no acute distress Skin:  Warm and dry Psych:  No mood changes, alert and cooperative,teary Reviewed Korea with Karen Suarez, that baby stopped growing at about 6 weeks and no FHM today, so this is miscarriage, can wait and see what body does, or can take cytotec, but make no decision today, talk with husband and mom and let me know on Monday. Will check QHCG and ABO Rh today.   Impression: 1. Miscarriage - Beta hCG quant (ref lab) - ABO/Rh    Plan: Review handouts on miscarriage by Karen Suarez and Karen Suarez Check Karen Suarez and ABORh today  Call me with what she wants to do Follow up in 2 weeks or sooner if needed.

## 2018-06-27 LAB — ABO/RH: Rh Factor: POSITIVE

## 2018-06-27 LAB — BETA HCG QUANT (REF LAB): hCG Quant: 10419 m[IU]/mL

## 2018-07-08 ENCOUNTER — Encounter: Payer: Self-pay | Admitting: *Deleted

## 2018-07-09 ENCOUNTER — Other Ambulatory Visit: Payer: Self-pay

## 2018-07-09 ENCOUNTER — Encounter: Payer: Self-pay | Admitting: Adult Health

## 2018-07-09 ENCOUNTER — Ambulatory Visit (INDEPENDENT_AMBULATORY_CARE_PROVIDER_SITE_OTHER): Payer: BLUE CROSS/BLUE SHIELD | Admitting: Adult Health

## 2018-07-09 VITALS — BP 134/84 | HR 91 | Ht 61.0 in | Wt 208.0 lb

## 2018-07-09 DIAGNOSIS — O039 Complete or unspecified spontaneous abortion without complication: Secondary | ICD-10-CM

## 2018-07-09 NOTE — Progress Notes (Signed)
Patient ID: Karen Suarez, female   DOB: [birth year], 23 y.o.   MRN: [id number] History of Present Illness: Karen Suarez is a 23 year old white female, married, G1P0010, back in follow up after miscarriage, she passed tissue 06/27/2018, and bled about 3 days and then spotted about another 3 and it has stopped. She desires to get pregnant as soon as possible.  PCP is Dr Lilyan Punt.    Current Medications, Allergies, Past Medical History, Past Surgical History, Family History and Social History were reviewed in [employer] record.     Review of Systems: No bleeding now    Physical Exam:BP 134/84 (BP Location: Left Arm, Patient Position: Sitting, Cuff Size: Normal)   Pulse 91   Ht 5\' 1"  (1.549 m)   Wt 208 lb (94.3 kg)   LMP 03/12/2018   BMI 39.30 kg/m  General:  Well developed, well nourished, no acute distress Skin:  Warm and dry Lungs; Clear to auscultation bilaterally Cardiovascular: Regular rate and rhythm Psych:  No mood changes, alert and cooperative,seems happy PHQ 2 score 0.  Blood type is A+.   Impression and Plan: 1. Miscarriage - Beta hCG quant (ref lab), will follow up QHCG till 5 or <  -continue Flintstones Follow up in 2 weeks

## 2018-07-10 ENCOUNTER — Ambulatory Visit: Payer: BLUE CROSS/BLUE SHIELD | Admitting: Adult Health

## 2018-07-10 LAB — BETA HCG QUANT (REF LAB): hCG Quant: 27 m[IU]/mL

## 2018-07-22 ENCOUNTER — Encounter: Payer: Self-pay | Admitting: *Deleted

## 2018-07-23 ENCOUNTER — Ambulatory Visit: Payer: BLUE CROSS/BLUE SHIELD | Admitting: Adult Health

## 2018-07-23 ENCOUNTER — Other Ambulatory Visit: Payer: Self-pay

## 2018-07-23 ENCOUNTER — Other Ambulatory Visit: Payer: BC Managed Care – PPO

## 2018-07-23 ENCOUNTER — Other Ambulatory Visit: Payer: Self-pay | Admitting: Adult Health

## 2018-07-23 DIAGNOSIS — O039 Complete or unspecified spontaneous abortion without complication: Secondary | ICD-10-CM

## 2018-07-23 NOTE — Progress Notes (Signed)
Ck QHCG  

## 2018-07-24 LAB — BETA HCG QUANT (REF LAB): hCG Quant: 1 m[IU]/mL

## 2018-08-06 ENCOUNTER — Other Ambulatory Visit: Payer: Self-pay | Admitting: Adult Health

## 2018-08-06 MED ORDER — PHENAZOPYRIDINE HCL 200 MG PO TABS: 200.0000 mg | ORAL_TABLET | Freq: Three times a day (TID) | ORAL | 0 refills | Status: DC | PRN

## 2018-08-06 MED ORDER — SULFAMETHOXAZOLE-TRIMETHOPRIM 800-160 MG PO TABS: 1.0000 | ORAL_TABLET | Freq: Two times a day (BID) | ORAL | 0 refills | Status: DC

## 2018-08-06 NOTE — Progress Notes (Signed)
rx septra ds and pyridium for UTi

## 2018-08-15 ENCOUNTER — Other Ambulatory Visit: Payer: Self-pay | Admitting: Adult Health

## 2018-11-14 ENCOUNTER — Other Ambulatory Visit: Payer: Self-pay | Admitting: Obstetrics & Gynecology

## 2018-11-14 DIAGNOSIS — N970 Female infertility associated with anovulation: Secondary | ICD-10-CM

## 2018-11-20 ENCOUNTER — Ambulatory Visit
Admission: RE | Admit: 2018-11-20 | Discharge: 2018-11-20 | Disposition: A | Discharge status: Home / Self Care | Payer: BC Managed Care – PPO | Source: Ambulatory Visit | Attending: Obstetrics & Gynecology | Admitting: Obstetrics & Gynecology

## 2018-11-20 DIAGNOSIS — N970 Female infertility associated with anovulation: Secondary | ICD-10-CM

## 2018-11-21 ENCOUNTER — Other Ambulatory Visit: Payer: BC Managed Care – PPO

## 2019-01-23 ENCOUNTER — Other Ambulatory Visit: Payer: Self-pay

## 2019-01-23 ENCOUNTER — Encounter (HOSPITAL_COMMUNITY): Payer: Self-pay | Admitting: *Deleted

## 2019-01-23 ENCOUNTER — Inpatient Hospital Stay (HOSPITAL_COMMUNITY)
Admission: AD | Admit: 2019-01-23 | Discharge: 2019-01-23 | Disposition: A | Discharge status: Home / Self Care | Payer: BC Managed Care – PPO | Attending: Obstetrics and Gynecology | Admitting: Obstetrics and Gynecology

## 2019-01-23 DIAGNOSIS — R109 Unspecified abdominal pain: Secondary | ICD-10-CM | POA: Diagnosis not present

## 2019-01-23 DIAGNOSIS — Z833 Family history of diabetes mellitus: Secondary | ICD-10-CM | POA: Diagnosis not present

## 2019-01-23 DIAGNOSIS — O26891 Other specified pregnancy related conditions, first trimester: Secondary | ICD-10-CM

## 2019-01-23 DIAGNOSIS — O99611 Diseases of the digestive system complicating pregnancy, first trimester: Secondary | ICD-10-CM | POA: Diagnosis not present

## 2019-01-23 DIAGNOSIS — Z362 Encounter for other antenatal screening follow-up: Secondary | ICD-10-CM

## 2019-01-23 DIAGNOSIS — K30 Functional dyspepsia: Secondary | ICD-10-CM | POA: Insufficient documentation

## 2019-01-23 DIAGNOSIS — Z3A10 10 weeks gestation of pregnancy: Secondary | ICD-10-CM | POA: Diagnosis not present

## 2019-01-23 LAB — URINALYSIS, ROUTINE W REFLEX MICROSCOPIC
Bilirubin Urine: NEGATIVE
Glucose, UA: NEGATIVE mg/dL
Hgb urine dipstick: NEGATIVE
Ketones, ur: NEGATIVE mg/dL
Leukocytes,Ua: NEGATIVE
Nitrite: NEGATIVE
Protein, ur: NEGATIVE mg/dL
Specific Gravity, Urine: 1.011 (ref 1.005–1.030)
pH: 7 (ref 5.0–8.0)

## 2019-01-23 LAB — CBC WITH DIFFERENTIAL/PLATELET
Abs Immature Granulocytes: 0.06 10*3/uL (ref 0.00–0.07)
Basophils Absolute: 0 10*3/uL (ref 0.0–0.1)
Basophils Relative: 0 %
Eosinophils Absolute: 0.2 10*3/uL (ref 0.0–0.5)
Eosinophils Relative: 2 %
HCT: 37.8 % (ref 36.0–46.0)
Hemoglobin: 12.7 g/dL (ref 12.0–15.0)
Immature Granulocytes: 1 %
Lymphocytes Relative: 33 %
Lymphs Abs: 3.2 10*3/uL (ref 0.7–4.0)
MCH: 28.6 pg (ref 26.0–34.0)
MCHC: 33.6 g/dL (ref 30.0–36.0)
MCV: 85.1 fL (ref 80.0–100.0)
Monocytes Absolute: 0.7 10*3/uL (ref 0.1–1.0)
Monocytes Relative: 7 %
Neutro Abs: 5.7 10*3/uL (ref 1.7–7.7)
Neutrophils Relative %: 57 %
Platelets: 328 10*3/uL (ref 150–400)
RBC: 4.44 MIL/uL (ref 3.87–5.11)
RDW: 12.8 % (ref 11.5–15.5)
WBC: 9.9 10*3/uL (ref 4.0–10.5)
nRBC: 0 % (ref 0.0–0.2)

## 2019-01-23 LAB — COMPREHENSIVE METABOLIC PANEL
ALT: 21 U/L (ref 0–44)
AST: 16 U/L (ref 15–41)
Albumin: 3.3 g/dL — ABNORMAL LOW (ref 3.5–5.0)
Alkaline Phosphatase: 78 U/L (ref 38–126)
Anion gap: 9 (ref 5–15)
BUN: 5 mg/dL — ABNORMAL LOW (ref 6–20)
CO2: 23 mmol/L (ref 22–32)
Calcium: 9.4 mg/dL (ref 8.9–10.3)
Chloride: 103 mmol/L (ref 98–111)
Creatinine, Ser: 0.51 mg/dL (ref 0.44–1.00)
GFR calc Af Amer: >60 mL/min (ref >60)
GFR calc non Af Amer: >60 mL/min (ref >60)
Glucose, Bld: 86 mg/dL (ref 70–99)
Potassium: 3.5 mmol/L (ref 3.5–5.1)
Sodium: 135 mmol/L (ref 135–145)
Total Bilirubin: 0.5 mg/dL (ref 0.3–1.2)
Total Protein: 6.6 g/dL (ref 6.5–8.1)

## 2019-01-23 LAB — LIPASE, BLOOD: Lipase: 24 U/L (ref 11–51)

## 2019-01-23 LAB — AMYLASE: Amylase: 37 U/L (ref 28–100)

## 2019-01-23 NOTE — MAU Note (Signed)
.   Karen Suarez is a 23 y.o. at [redacted]w[redacted]d here in MAU reporting: sharp pains in her lower abdomen that started last night.Denies any VB or abnormal discharge States she had a miscarriage in May and is concerned.  LMP: irregular has had U/S Onset of complaint: last night Pain score: 7 Vitals:   01/23/19 1050  BP: 139/83  Pulse: (!) 107  Resp: 16  Temp: 98.2 F (36.8 C)  SpO2: 100%     FHT: Lab orders placed from triage: UA/UPT

## 2019-01-23 NOTE — Discharge Instructions (Signed)
Please watch what you are eating. Keep a food diary listing all foods and drinks that you take in your body; including symptoms with each meal. Take with you to your doctor's appointments.

## 2019-01-23 NOTE — MAU Provider Note (Signed)
History     CSN: [id number]  Arrival date and time: 01/23/19 1029   First Provider Initiated Contact with Patient 01/23/19 1113      Chief Complaint  Patient presents with  . Abdominal Pain   HPI  Ms.  Karen Suarez is a 23 y.o. year old G71P0010 female at [redacted]w[redacted]d weeks gestation who presents to MAU reporting that she started having a sharp, pain in her upper abdomen last night about 2000; rated 7/10. She reports it as being "the worst". She reports having eaten a salad with cucumbers, onions, lettuce, spinach, and homemade ranch dressing (made with mayo and ranch seasoning). She reports having diarrhea and gas pains along with the abdominal pain after eating. She states the pain stopped at 2200 last night. She states she "has always had stomach problems, but they are worse since she was started on Metformin by Dr. Mora Appl." She reports taking Metformin 500 mg daily, "got up to 1000 mg once", but stopped taking it on 12/20/2018 d/t the GI issues. She states "my stomach hasn't been right since." She denies any pain now, VB, LOF, or abnormal vaginal d/c. She receives [facility] with [facility] (Dr. Mora Appl), was seen on 01/12/2019. Her next appt with [facility] is on 02/09/2019.  Past Medical History:  Diagnosis Date  . Family history of adverse reaction to anesthesia    pt's father has hx. of being hard to wake up post-op  . Tonsillar and adenoid hypertrophy 01/2015    Past Surgical History:  Procedure Laterality Date  . NO PAST SURGERIES    . TONSILLECTOMY AND ADENOIDECTOMY N/A 01/31/2015   Procedure: TONSILLECTOMY AND ADENOIDECTOMY;  Surgeon: Newman Pies, MD;  Location: [facility];  Service: ENT;  Laterality: N/A;    Family History  Problem Relation Age of Onset  . Diabetes Maternal Grandfather   . Diabetes Father   . Anesthesia problems Father        hard to wake up post-op  . Heart disease Father   . Diabetes Paternal Grandfather     Social History   Tobacco Use  . Smoking status:  Never Smoker  . Smokeless tobacco: Never Used  Substance Use Topics  . Alcohol use: No  . Drug use: No    Allergies: No Known Allergies  No medications prior to admission.    Review of Systems  Constitutional: Negative.   HENT: Negative.   Eyes: Negative.   Respiratory: Negative.   Cardiovascular: Negative.   Gastrointestinal: Negative.  Negative for abdominal pain (was in the upper abdomen last night, none now).  Endocrine: Negative.   Genitourinary: Negative.   Musculoskeletal: Negative.   Skin: Negative.   Allergic/Immunologic: Negative.   Neurological: Negative.   Hematological: Negative.   Psychiatric/Behavioral: Negative.    Physical Exam   Blood pressure 135/75, pulse (!) 107, temperature 98.2 F (36.8 C), resp. rate 16, height 5\' 1"  (1.549 m), weight 92.1 kg, last menstrual period 10/19/2018, SpO2 100 %.  Physical Exam  Nursing note and vitals reviewed. Constitutional: She is oriented to person, place, and time. She appears well-developed and well-nourished.  HENT:  Head: Normocephalic and atraumatic.  Eyes: Pupils are equal, round, and reactive to light.  Neck: Normal range of motion.  Cardiovascular: Normal rate and regular rhythm.  Respiratory: Effort normal.  GI: Soft.  Genitourinary:    Genitourinary Comments: deferred   Musculoskeletal: Normal range of motion.  Neurological: She is alert and oriented to person, place, and time. She has normal reflexes.  Skin: Skin is warm and dry.  Psychiatric: She has a normal mood and affect. Her behavior is normal. Judgment and thought content normal.    MAU Course  Procedures Patient informed that the ultrasound is considered a limited OB ultrasound and is not intended to be a complete ultrasound exam.  Patient also informed that the ultrasound is not being completed with the intent of assessing for fetal or placental anomalies or any pelvic abnormalities.  Explained that the purpose of today's ultrasound is to  assess for viability.  Baby with (+) cardiac activity; FHR = 170 bpm using doppler feature on U/S. Patient acknowledges the purpose of the exam and the limitations of the study. Picture of fetus given to patient. Patient very excited to see baby and hear the heartbeat on U/S.     MDM [facility] CBC w/Diff CMP Lipase  Amylase  Results for orders placed or performed during the hospital encounter of 01/23/19 (from the past 24 hour(s))  Urinalysis, Routine w reflex microscopic     Status: Abnormal   Collection Time: 01/23/19 11:19 AM  Result Value Ref Range   Color, Urine YELLOW YELLOW   APPearance HAZY (A) CLEAR   Specific Gravity, Urine 1.011 1.005 - 1.030   pH 7.0 5.0 - 8.0   Glucose, UA NEGATIVE NEGATIVE mg/dL   Hgb urine dipstick NEGATIVE NEGATIVE   Bilirubin Urine NEGATIVE NEGATIVE   Ketones, ur NEGATIVE NEGATIVE mg/dL   Protein, ur NEGATIVE NEGATIVE mg/dL   Nitrite NEGATIVE NEGATIVE   Leukocytes,Ua NEGATIVE NEGATIVE  cbc w diff     Status: None   Collection Time: 01/23/19 11:39 AM  Result Value Ref Range   WBC 9.9 4.0 - 10.5 K/uL   RBC 4.44 3.87 - 5.11 MIL/uL   Hemoglobin 12.7 12.0 - 15.0 g/dL   HCT 40.937.8 81.136.0 - 91.446.0 %   MCV 85.1 80.0 - 100.0 fL   MCH 28.6 26.0 - 34.0 pg   MCHC 33.6 30.0 - 36.0 g/dL   RDW 78.212.8 95.611.5 - 21.315.5 %   Platelets 328 150 - 400 K/uL   nRBC 0.0 0.0 - 0.2 %   Neutrophils Relative % 57 %   Neutro Abs 5.7 1.7 - 7.7 K/uL   Lymphocytes Relative 33 %   Lymphs Abs 3.2 0.7 - 4.0 K/uL   Monocytes Relative 7 %   Monocytes Absolute 0.7 0.1 - 1.0 K/uL   Eosinophils Relative 2 %   Eosinophils Absolute 0.2 0.0 - 0.5 K/uL   Basophils Relative 0 %   Basophils Absolute 0.0 0.0 - 0.1 K/uL   Immature Granulocytes 1 %   Abs Immature Granulocytes 0.06 0.00 - 0.07 K/uL  cmp     Status: Abnormal   Collection Time: 01/23/19 11:39 AM  Result Value Ref Range   Sodium 135 135 - 145 mmol/L   Potassium 3.5 3.5 - 5.1 mmol/L   Chloride 103 98 - 111 mmol/L   CO2 23 22 - 32  mmol/L   Glucose, Bld 86 70 - 99 mg/dL   BUN 5 (L) 6 - 20 mg/dL   Creatinine, Ser 0.860.51 0.44 - 1.00 mg/dL   Calcium 9.4 8.9 - 57.810.3 mg/dL   Total Protein 6.6 6.5 - 8.1 g/dL   Albumin 3.3 (L) 3.5 - 5.0 g/dL   AST 16 15 - 41 U/L   ALT 21 0 - 44 U/L   Alkaline Phosphatase 78 38 - 126 U/L   Total Bilirubin 0.5 0.3 - 1.2 mg/dL   GFR  calc non Af Amer >60 >60 mL/min   GFR calc Af Amer >60 >60 mL/min   Anion gap 9 5 - 15  lipase     Status: None   Collection Time: 01/23/19 11:39 AM  Result Value Ref Range   Lipase 24 11 - 51 U/L  amylase     Status: None   Collection Time: 01/23/19 11:39 AM  Result Value Ref Range   Amylase 37 28 - 100 U/L    Assessment and Plan  Mild dietary indigestion  - Reassurance given that baby is doing well - Advised that stomach upset that she is describing is not c/w SAB or OB related - Information provided on indigestion and abdominal pain in adult - Advised to avoid high fat and dairy foods, keep a food diary to list foods and possible symptoms caused by those foods to take with her to doctor's appts - Discharge patient - Keep scheduled appt with [facility] on 02/09/2019 - Patient verbalized an understanding of the plan of care and agrees.     Laury Deep, MSN, CNM 01/23/2019, 11:36 AM

## 2019-02-10 ENCOUNTER — Ambulatory Visit (INDEPENDENT_AMBULATORY_CARE_PROVIDER_SITE_OTHER): Payer: BC Managed Care – PPO | Admitting: Family Medicine

## 2019-02-10 ENCOUNTER — Other Ambulatory Visit: Payer: Self-pay

## 2019-02-10 DIAGNOSIS — J019 Acute sinusitis, unspecified: Secondary | ICD-10-CM

## 2019-02-10 MED ORDER — AMOXICILLIN 500 MG PO CAPS: ORAL_CAPSULE | ORAL | 0 refills | Status: DC

## 2019-02-10 NOTE — Progress Notes (Signed)
   Subjective:  Audio only  Patient ID: Karen Suarez, female    DOB: Feb 19, 1996, 23 y.o.   MRN: [id number]  Sinusitis This is a new problem. The current episode started yesterday. There has been no fever. Associated symptoms include coughing, headaches and sinus pressure. (Green mucus when blowing nose. Pt is almost [redacted] week pregnant ) Past treatments include nothing.   Virtual Visit via Telephone Note  I connected with Karen Suarez on 02/10/19 at  4:10 PM EST by telephone and verified that I am speaking with the correct person using two identifiers.  Location: Patient: home Provider: office   I discussed the limitations, risks, security and privacy concerns of performing an evaluation and management service by telephone and the availability of in person appointments. I also discussed with the patient that there may be a patient responsible charge related to this service. The patient expressed understanding and agreed to proceed.   History of Present Illness:    Observations/Objective:   Assessment and Plan:   Follow Up Instructions:    I discussed the assessment and treatment plan with the patient. The patient was provided an opportunity to ask questions and all were answered. The patient agreed with the plan and demonstrated an understanding of the instructions.   The patient was advised to call back or seek an in-person evaluation if the symptoms worsen or if the condition fails to improve as anticipated.  I provided 16 minutes of non-face-to-face time during this encounter.   Vicente Males, LPN  Cough and tickly in the throat  No fever   Notes nasal disch gunky   Real gunky nasal disch    Review of Systems  HENT: Positive for sinus pressure.   Respiratory: Positive for cough.   Neurological: Positive for headaches.       Objective:   Physical Exam  Virtual      Assessment & Plan:  Impression rhinosinusitis.  Patient is pregnant.  13 weeks.   Antibiotics prescribed.  Symptom care discussed.  Potential for COVID-19 discussed.  No chest symptoms.  No cough from the chest.  No shortness of breath no sore throat.  Discussed COVID-19 testing patient to pursue locally

## 2019-02-13 ENCOUNTER — Encounter: Payer: Self-pay | Admitting: Family Medicine

## 2019-02-15 ENCOUNTER — Other Ambulatory Visit: Payer: Self-pay

## 2019-03-12 ENCOUNTER — Other Ambulatory Visit (HOSPITAL_COMMUNITY): Payer: Self-pay | Admitting: Obstetrics and Gynecology

## 2019-03-12 DIAGNOSIS — Z3A20 20 weeks gestation of pregnancy: Secondary | ICD-10-CM

## 2019-03-12 DIAGNOSIS — Z363 Encounter for antenatal screening for malformations: Secondary | ICD-10-CM

## 2019-03-12 DIAGNOSIS — N926 Irregular menstruation, unspecified: Secondary | ICD-10-CM

## 2019-04-06 ENCOUNTER — Other Ambulatory Visit (HOSPITAL_COMMUNITY): Payer: Self-pay | Admitting: *Deleted

## 2019-04-06 ENCOUNTER — Other Ambulatory Visit: Payer: Self-pay

## 2019-04-06 ENCOUNTER — Ambulatory Visit (HOSPITAL_COMMUNITY)
Admission: RE | Admit: 2019-04-06 | Discharge: 2019-04-06 | Disposition: A | Discharge status: Home / Self Care | Payer: BC Managed Care – PPO | Source: Ambulatory Visit | Attending: Obstetrics and Gynecology | Admitting: Obstetrics and Gynecology

## 2019-04-06 DIAGNOSIS — Z3A20 20 weeks gestation of pregnancy: Secondary | ICD-10-CM | POA: Insufficient documentation

## 2019-04-06 DIAGNOSIS — N926 Irregular menstruation, unspecified: Secondary | ICD-10-CM | POA: Insufficient documentation

## 2019-04-06 DIAGNOSIS — Z362 Encounter for other antenatal screening follow-up: Secondary | ICD-10-CM

## 2019-04-06 DIAGNOSIS — O99212 Obesity complicating pregnancy, second trimester: Secondary | ICD-10-CM | POA: Diagnosis not present

## 2019-04-06 DIAGNOSIS — Z363 Encounter for antenatal screening for malformations: Secondary | ICD-10-CM | POA: Insufficient documentation

## 2019-04-14 ENCOUNTER — Encounter (HOSPITAL_COMMUNITY): Payer: Self-pay | Admitting: Obstetrics and Gynecology

## 2019-04-14 ENCOUNTER — Other Ambulatory Visit (HOSPITAL_COMMUNITY): Payer: Self-pay | Admitting: Obstetrics and Gynecology

## 2019-05-04 ENCOUNTER — Other Ambulatory Visit: Payer: Self-pay

## 2019-05-04 ENCOUNTER — Ambulatory Visit (HOSPITAL_COMMUNITY)
Admission: RE | Admit: 2019-05-04 | Discharge: 2019-05-04 | Disposition: A | Discharge status: Home / Self Care | Payer: BC Managed Care – PPO | Source: Ambulatory Visit | Attending: Obstetrics | Admitting: Obstetrics

## 2019-05-04 ENCOUNTER — Encounter (HOSPITAL_COMMUNITY): Payer: Self-pay

## 2019-05-04 ENCOUNTER — Ambulatory Visit (HOSPITAL_COMMUNITY): Payer: BC Managed Care – PPO

## 2019-05-04 DIAGNOSIS — Z3A24 24 weeks gestation of pregnancy: Secondary | ICD-10-CM

## 2019-05-04 DIAGNOSIS — O99212 Obesity complicating pregnancy, second trimester: Secondary | ICD-10-CM | POA: Diagnosis not present

## 2019-05-04 DIAGNOSIS — Z362 Encounter for other antenatal screening follow-up: Secondary | ICD-10-CM | POA: Insufficient documentation

## 2019-06-29 ENCOUNTER — Other Ambulatory Visit: Payer: Self-pay

## 2019-06-29 ENCOUNTER — Ambulatory Visit: Payer: BC Managed Care – PPO | Admitting: *Deleted

## 2019-06-29 ENCOUNTER — Ambulatory Visit: Payer: BC Managed Care – PPO | Admitting: Maternal & Fetal Medicine

## 2019-06-29 ENCOUNTER — Other Ambulatory Visit: Payer: Self-pay | Admitting: Obstetrics and Gynecology

## 2019-06-29 ENCOUNTER — Encounter: Payer: Self-pay | Admitting: *Deleted

## 2019-06-29 ENCOUNTER — Ambulatory Visit: Payer: BC Managed Care – PPO | Attending: Obstetrics and Gynecology

## 2019-06-29 DIAGNOSIS — O1213 Gestational proteinuria, third trimester: Secondary | ICD-10-CM

## 2019-06-29 DIAGNOSIS — O3663X0 Maternal care for excessive fetal growth, third trimester, not applicable or unspecified: Secondary | ICD-10-CM

## 2019-06-29 DIAGNOSIS — O133 Gestational [pregnancy-induced] hypertension without significant proteinuria, third trimester: Secondary | ICD-10-CM | POA: Diagnosis not present

## 2019-06-29 DIAGNOSIS — K30 Functional dyspepsia: Secondary | ICD-10-CM | POA: Diagnosis present

## 2019-06-29 DIAGNOSIS — E669 Obesity, unspecified: Secondary | ICD-10-CM

## 2019-06-29 DIAGNOSIS — R809 Proteinuria, unspecified: Secondary | ICD-10-CM | POA: Insufficient documentation

## 2019-06-29 DIAGNOSIS — Z3A32 32 weeks gestation of pregnancy: Secondary | ICD-10-CM

## 2019-06-29 DIAGNOSIS — Z362 Encounter for other antenatal screening follow-up: Secondary | ICD-10-CM

## 2019-06-29 DIAGNOSIS — O99213 Obesity complicating pregnancy, third trimester: Secondary | ICD-10-CM

## 2019-06-30 ENCOUNTER — Other Ambulatory Visit: Payer: Self-pay | Admitting: *Deleted

## 2019-06-30 DIAGNOSIS — O133 Gestational [pregnancy-induced] hypertension without significant proteinuria, third trimester: Secondary | ICD-10-CM

## 2019-07-02 ENCOUNTER — Other Ambulatory Visit: Payer: Self-pay | Admitting: Obstetrics and Gynecology

## 2019-07-02 DIAGNOSIS — Z3A33 33 weeks gestation of pregnancy: Secondary | ICD-10-CM

## 2019-07-02 DIAGNOSIS — Z362 Encounter for other antenatal screening follow-up: Secondary | ICD-10-CM

## 2019-07-06 ENCOUNTER — Other Ambulatory Visit: Payer: Self-pay

## 2019-07-06 ENCOUNTER — Ambulatory Visit: Payer: BC Managed Care – PPO | Attending: Obstetrics and Gynecology

## 2019-07-06 ENCOUNTER — Ambulatory Visit: Payer: BC Managed Care – PPO | Admitting: *Deleted

## 2019-07-06 VITALS — BP 127/75 | HR 105 | Temp 98.2°F

## 2019-07-06 DIAGNOSIS — Z3A33 33 weeks gestation of pregnancy: Secondary | ICD-10-CM | POA: Insufficient documentation

## 2019-07-06 DIAGNOSIS — Z362 Encounter for other antenatal screening follow-up: Secondary | ICD-10-CM | POA: Insufficient documentation

## 2019-07-06 DIAGNOSIS — O133 Gestational [pregnancy-induced] hypertension without significant proteinuria, third trimester: Secondary | ICD-10-CM

## 2019-07-06 DIAGNOSIS — K30 Functional dyspepsia: Secondary | ICD-10-CM | POA: Diagnosis present

## 2019-07-07 NOTE — Progress Notes (Unsigned)
MFM Consultation  Requesting provider: Everett Graff, MD Date of Service : 06/29/19 Reason for request: elevated protein creatinine ratio   Karen Suarez is a 24 yo G2P0 who is here at 33 w 5 d at the request of Dr. Mancel Bale for consultation regarding elevated urinary protein creatinine ratio.   She is doing well today without complaints. She denies signs or symptoms of preeclampsia or preterm labor. She has a low risk panorama and AFP. She denies history of hypertension. Her pregnatal blood pressures are on average 110/ 70's. She denies blurry vision and general OB prenatal labs are within normal limits.  Pregnancy issue:  Elevated proteinuria on 05/05 she had an PCR or 376 mg/g with a normal range of < 300. She has a normal CMP, CBC. Her blood pressure is < 140/90 however she conveyed that it has been creeping up to the 130's/80's and she has documented a home blood pressure of 145/83 but this has not been documented in office. Again she is asymptomatic.  Vitals: BP 130/80  Pulse 110 bpm 5'1"   OB History  Gravida Para Term Preterm AB Living  2 0 0 0 1 0  SAB TAB Ectopic Multiple Live Births  1 0 0 0      # Outcome Date GA Lbr Len/2nd Weight Sex Delivery Anes PTL Lv  2 Current           1 SAB            Past Medical History:  Diagnosis Date  . Family history of adverse reaction to anesthesia    pt's father has hx. of being hard to wake up post-op  . Tonsillar and adenoid hypertrophy 01/2015   Past Surgical History:  Procedure Laterality Date  . NO PAST SURGERIES    . TONSILLECTOMY AND ADENOIDECTOMY N/A 01/31/2015   Procedure: TONSILLECTOMY AND ADENOIDECTOMY;  Surgeon: Leta Baptist, MD;  Location: [facility];  Service: ENT;  Laterality: N/A;   Social History   Socioeconomic History  . Marital status: Married    Spouse name: Not on file  . Number of children: Not on file  . Years of education: Not on file  . Highest education level: Not on file  Occupational  History  . Not on file  Tobacco Use  . Smoking status: Never Smoker  . Smokeless tobacco: Never Used  Substance and Sexual Activity  . Alcohol use: No  . Drug use: No  . Sexual activity: Yes    Birth control/protection: None  Other Topics Concern  . Not on file  Social History Narrative  . Not on file   Social Determinants of Health   Financial Resource Strain:   . Difficulty of Paying Living Expenses:   Food Insecurity:   . Worried About Charity fundraiser in the Last Year:   . Arboriculturist in the Last Year:   Transportation Needs:   . Film/video editor (Medical):   Marland Kitchen Lack of Transportation (Non-Medical):   Physical Activity:   . Days of Exercise per Week:   . Minutes of Exercise per Session:   Stress:   . Feeling of Stress :   Social Connections:   . Frequency of Communication with Friends and Family:   . Frequency of Social Gatherings with Friends and Family:   . Attends Religious Services:   . Active Member of Clubs or Organizations:   . Attends Archivist Meetings:   Marland Kitchen Marital Status:  Intimate Partner Violence:   . Fear of Current or Ex-Partner:   . Emotionally Abused:   Marland Kitchen Physically Abused:   . Sexually Abused:    No Known Allergies Current Outpatient Medications on File Prior to Visit  Medication Sig Dispense Refill  . amoxicillin (AMOXIL) 500 MG capsule Take one capsule po TID for 10 days 30 capsule 0  . NON FORMULARY flintstone vitamin    . Prenatal Vit-Fe Fumarate-FA (PRENATAL MULTIVITAMIN) TABS tablet Take 1 tablet by mouth daily at 12 noon.     No current facility-administered medications on file prior to visit.    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  Counseling:   I discussed with Karen Suarez the normal range of proteinuria we discussed the diagnosis of idiopathic proteinuria and the differential diagnosis to include normal  variant, underlying renal disease and/or possible development of preeclampsia without severe features.   All differential diagnosis as mentioned above have been associated with preterm birth and fetal growth delays. However, it appears that Karen Suarez has normal growth if anything LGA.  Secondly, there are trends of increasing blood pressure beyond what was observed in early pregnancy. I discussed with Karen Suarez that vigilance of her blood pressure measurements is important as she nears close to term. We discussed the definitiona of Gestational hypertension and preeclampsia include elevated blood pressure > 4 hr apart but < 7 days apart with or without proteinuria. However, if labs are abnormal or maternal symptoms present we would define that as preeclampsia.   As such a consideration given the increased blood pressure and abnormal proteinuria a conservative approach would to repeat labs on a weekly basis as well as antenatal testing including NST/AFI or BPP.   Consider delivery between 37-39 weeks. IF preeclampsia is diagnosed consider delivery at 37 weeks.   I spent 45 minute with > 50% in face to face consultation. All questions answered.  Novella Olive, MD

## 2019-07-07 NOTE — Progress Notes (Signed)
MFM Consultation  Requesting provider: Osborn Coho, MD Date of Service : 06/29/19 Reason for request: elevated protein creatinine ratio   Karen Suarez is a 24 yo G2P0 who is here at 11 w 5 d at the request of Dr. Su Hilt for consultation regarding elevated urinary protein creatinine ratio.   She is doing well today without complaints. She denies signs or symptoms of preeclampsia or preterm labor. She has a low risk panorama and AFP. She denies history of hypertension. Her pregnatal blood pressures are on average 110/ 70's. She denies blurry vision and general OB prenatal labs are within normal limits.  Pregnancy issue:  Elevated proteinuria on 05/05 she had an PCR or 376 mg/g with a normal range of < 300. She has a normal CMP, CBC. Her blood pressure is < 140/90 however she conveyed that it has been creeping up to the 130's/80's and she has documented a home blood pressure of 145/83 but this has not been documented in office. Again she is asymptomatic.  Vitals: BP 130/80  Pulse 110 bpm 5'1"   OB History  Gravida Para Term Preterm AB Living  2 0 0 0 1 0  SAB TAB Ectopic Multiple Live Births  1 0 0 0      # Outcome Date GA Lbr Len/2nd Weight Sex Delivery Anes PTL Lv  2 Current           1 SAB            Past Medical History:  Diagnosis Date  . Family history of adverse reaction to anesthesia    pt's father has hx. of being hard to wake up post-op  . Tonsillar and adenoid hypertrophy 01/2015   Past Surgical History:  Procedure Laterality Date  . NO PAST SURGERIES    . TONSILLECTOMY AND ADENOIDECTOMY N/A 01/31/2015   Procedure: TONSILLECTOMY AND ADENOIDECTOMY;  Surgeon: Newman Pies, MD;  Location: [facility];  Service: ENT;  Laterality: N/A;   Family History  Problem Relation Age of Onset  . Diabetes Maternal Grandfather   . Diabetes Father   . Anesthesia problems Father        hard to wake up post-op  . Heart disease Father   . Diabetes Paternal Grandfather     Social History   Socioeconomic History  . Marital status: Married    Spouse name: Not on file  . Number of children: Not on file  . Years of education: Not on file  . Highest education level: Not on file  Occupational History  . Not on file  Tobacco Use  . Smoking status: Never Smoker  . Smokeless tobacco: Never Used  Substance and Sexual Activity  . Alcohol use: No  . Drug use: No  . Sexual activity: Yes    Birth control/protection: None  Other Topics Concern  . Not on file  Social History Narrative  . Not on file   Social Determinants of Health   Financial Resource Strain:   . Difficulty of Paying Living Expenses:   Food Insecurity:   . Worried About Programme researcher, broadcasting/film/video in the Last Year:   . Barista in the Last Year:   Transportation Needs:   . Freight forwarder (Medical):   Marland Kitchen Lack of Transportation (Non-Medical):   Physical Activity:   . Days of Exercise per Week:   . Minutes of Exercise per Session:   Stress:   . Feeling of Stress :   Social Connections:   .  Frequency of Communication with Friends and Family:   . Frequency of Social Gatherings with Friends and Family:   . Attends Religious Services:   . Active Member of Clubs or Organizations:   . Attends Archivist Meetings:   Marland Kitchen Marital Status:   Intimate Partner Violence:   . Fear of Current or Ex-Partner:   . Emotionally Abused:   Marland Kitchen Physically Abused:   . Sexually Abused:    Current Outpatient Medications on File Prior to Visit  Medication Sig Dispense Refill  . amoxicillin (AMOXIL) 500 MG capsule Take one capsule po TID for 10 days 30 capsule 0  . NON FORMULARY flintstone vitamin    . Prenatal Vit-Fe Fumarate-FA (PRENATAL MULTIVITAMIN) TABS tablet Take 1 tablet by mouth daily at 12 noon.     No current facility-administered medications on file prior to visit.   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  Counseling:   I discussed with Karen Suarez the normal range of proteinuria we discussed the diagnosis of idiopathic proteinuria and the differential diagnosis to include normal variant, underlying renal disease and/or possible development of preeclampsia without severe features.   All differential diagnosis as mentioned above have been associated with preterm birth and fetal growth delays. However, it appears that Karen Suarez has normal growth if anything LGA.  Secondly, there are trends of increasing blood pressure beyond what was observed in early pregnancy. I discussed with Karen Suarez that vigilance of her blood pressure measurements is important as she nears close to term. We discussed the definitiona of Gestational hypertension and preeclampsia include elevated blood pressure > 4 hr apart but < 7 days apart with or without proteinuria. However, if labs are abnormal or maternal symptoms present we would define that as preeclampsia.   As such a consideration given the increased blood pressure and abnormal proteinuria a conservative approach would to repeat labs on a weekly basis as well as antenatal testing including NST/AFI or BPP.   Consider delivery between 37-39 weeks. IF preeclampsia is diagnosed consider delivery at 37 weeks.   I spent 45 minute with > 50% in face to face consultation. All questions answered.  Vikki Ports, MD

## 2019-07-13 ENCOUNTER — Ambulatory Visit: Payer: BC Managed Care – PPO | Attending: Obstetrics and Gynecology

## 2019-07-13 ENCOUNTER — Other Ambulatory Visit: Payer: Self-pay

## 2019-07-13 ENCOUNTER — Ambulatory Visit: Payer: BC Managed Care – PPO | Admitting: *Deleted

## 2019-07-13 VITALS — BP 129/86 | HR 105

## 2019-07-13 DIAGNOSIS — K30 Functional dyspepsia: Secondary | ICD-10-CM

## 2019-07-13 DIAGNOSIS — O0990 Supervision of high risk pregnancy, unspecified, unspecified trimester: Secondary | ICD-10-CM | POA: Diagnosis present

## 2019-07-13 DIAGNOSIS — O133 Gestational [pregnancy-induced] hypertension without significant proteinuria, third trimester: Secondary | ICD-10-CM | POA: Insufficient documentation

## 2019-07-13 DIAGNOSIS — Z3A34 34 weeks gestation of pregnancy: Secondary | ICD-10-CM | POA: Diagnosis not present

## 2019-07-17 ENCOUNTER — Encounter (HOSPITAL_COMMUNITY): Payer: Self-pay

## 2019-07-17 ENCOUNTER — Other Ambulatory Visit: Payer: Self-pay | Admitting: Obstetrics and Gynecology

## 2019-07-17 NOTE — Patient Instructions (Signed)
Karen Suarez  07/17/2019   Your procedure is scheduled on:  07/29/2019  Arrive at 1100 at Entrance C on [employer] at [facility]  and [employer]. You are invited to use the FREE valet parking or use the Visitor's parking deck.  Pick up the phone at the desk and dial [phone].  Call this number if you have problems the morning of surgery: [phone]  Remember:   Do not eat food:(After Midnight) Desps de medianoche.  Do not drink clear liquids: (After Midnight) Desps de medianoche.  Take these medicines the morning of surgery with A SIP OF WATER:  none   Do not wear jewelry, make-up or nail polish.  Do not wear lotions, powders, or perfumes. Do not wear deodorant.  Do not shave 48 hours prior to surgery.  Do not bring valuables to the hospital.  [facility] is not   responsible for any belongings or valuables brought to the hospital.  Contacts, dentures or bridgework may not be worn into surgery.  Leave suitcase in the car. After surgery it may be brought to your room.  For patients admitted to the hospital, checkout time is 11:00 AM the day of              discharge.      Please read over the following fact sheets that you were given:     Preparing for Surgery

## 2019-07-21 ENCOUNTER — Ambulatory Visit: Payer: BC Managed Care – PPO | Attending: Obstetrics and Gynecology

## 2019-07-21 ENCOUNTER — Encounter: Payer: Self-pay | Admitting: *Deleted

## 2019-07-21 ENCOUNTER — Other Ambulatory Visit: Payer: Self-pay

## 2019-07-21 ENCOUNTER — Other Ambulatory Visit: Payer: Self-pay | Admitting: Maternal & Fetal Medicine

## 2019-07-21 ENCOUNTER — Ambulatory Visit: Payer: BC Managed Care – PPO | Admitting: *Deleted

## 2019-07-21 DIAGNOSIS — E669 Obesity, unspecified: Secondary | ICD-10-CM

## 2019-07-21 DIAGNOSIS — O139 Gestational [pregnancy-induced] hypertension without significant proteinuria, unspecified trimester: Secondary | ICD-10-CM

## 2019-07-21 DIAGNOSIS — K30 Functional dyspepsia: Secondary | ICD-10-CM

## 2019-07-21 DIAGNOSIS — O3663X0 Maternal care for excessive fetal growth, third trimester, not applicable or unspecified: Secondary | ICD-10-CM | POA: Diagnosis not present

## 2019-07-21 DIAGNOSIS — O1213 Gestational proteinuria, third trimester: Secondary | ICD-10-CM

## 2019-07-21 DIAGNOSIS — O133 Gestational [pregnancy-induced] hypertension without significant proteinuria, third trimester: Secondary | ICD-10-CM | POA: Insufficient documentation

## 2019-07-21 DIAGNOSIS — O99213 Obesity complicating pregnancy, third trimester: Secondary | ICD-10-CM | POA: Diagnosis not present

## 2019-07-21 DIAGNOSIS — Z3A35 35 weeks gestation of pregnancy: Secondary | ICD-10-CM

## 2019-07-21 NOTE — Procedures (Signed)
Karen Suarez 02-08-1996 [redacted]w[redacted]d  Fetus A Non-Stress Test Interpretation for 07/21/19  Indication: Unsatisfactory BPP  Fetal Heart Rate A Mode: External Baseline Rate (A): 150 bpm Variability: Moderate Accelerations: 10 x 10, 15 x 15 Decelerations: None Multiple birth?: No  Uterine Activity Mode: Palpation, Toco Contraction Frequency (min): none noted Resting Tone Palpated: Relaxed Resting Time: Adequate  Interpretation (Fetal Testing) Nonstress Test Interpretation: Reactive Comments: Reviewed tracing with Dr. Judeth Cornfield

## 2019-07-27 ENCOUNTER — Ambulatory Visit (HOSPITAL_BASED_OUTPATIENT_CLINIC_OR_DEPARTMENT_OTHER): Payer: BC Managed Care – PPO

## 2019-07-27 ENCOUNTER — Ambulatory Visit: Payer: BC Managed Care – PPO | Admitting: *Deleted

## 2019-07-27 ENCOUNTER — Other Ambulatory Visit (HOSPITAL_COMMUNITY)
Admission: RE | Admit: 2019-07-27 | Discharge: 2019-07-27 | Disposition: A | Discharge status: Home / Self Care | Payer: BC Managed Care – PPO | Source: Ambulatory Visit | Attending: Obstetrics and Gynecology | Admitting: Obstetrics and Gynecology

## 2019-07-27 ENCOUNTER — Other Ambulatory Visit: Payer: Self-pay

## 2019-07-27 VITALS — BP 135/85 | HR 105

## 2019-07-27 DIAGNOSIS — Z3A00 Weeks of gestation of pregnancy not specified: Secondary | ICD-10-CM | POA: Insufficient documentation

## 2019-07-27 DIAGNOSIS — O133 Gestational [pregnancy-induced] hypertension without significant proteinuria, third trimester: Secondary | ICD-10-CM

## 2019-07-27 DIAGNOSIS — K30 Functional dyspepsia: Secondary | ICD-10-CM

## 2019-07-27 DIAGNOSIS — Z3A36 36 weeks gestation of pregnancy: Secondary | ICD-10-CM

## 2019-07-27 DIAGNOSIS — O1213 Gestational proteinuria, third trimester: Secondary | ICD-10-CM

## 2019-07-27 DIAGNOSIS — O99213 Obesity complicating pregnancy, third trimester: Secondary | ICD-10-CM

## 2019-07-27 DIAGNOSIS — Z20822 Contact with and (suspected) exposure to covid-19: Secondary | ICD-10-CM | POA: Insufficient documentation

## 2019-07-27 DIAGNOSIS — O3663X0 Maternal care for excessive fetal growth, third trimester, not applicable or unspecified: Secondary | ICD-10-CM | POA: Diagnosis not present

## 2019-07-27 DIAGNOSIS — O0990 Supervision of high risk pregnancy, unspecified, unspecified trimester: Secondary | ICD-10-CM

## 2019-07-27 DIAGNOSIS — E669 Obesity, unspecified: Secondary | ICD-10-CM

## 2019-07-27 HISTORY — DX: Gestational (pregnancy-induced) hypertension without significant proteinuria, unspecified trimester: O13.9

## 2019-07-27 HISTORY — DX: Other complications of anesthesia, initial encounter: T88.59XA

## 2019-07-27 LAB — CBC
HCT: 34 % — ABNORMAL LOW (ref 36.0–46.0)
Hemoglobin: 11.2 g/dL — ABNORMAL LOW (ref 12.0–15.0)
MCH: 28.4 pg (ref 26.0–34.0)
MCHC: 32.9 g/dL (ref 30.0–36.0)
MCV: 86.3 fL (ref 80.0–100.0)
Platelets: 250 10*3/uL (ref 150–400)
RBC: 3.94 MIL/uL (ref 3.87–5.11)
RDW: 14.6 % (ref 11.5–15.5)
WBC: 8.1 10*3/uL (ref 4.0–10.5)
nRBC: 0 % (ref 0.0–0.2)

## 2019-07-27 LAB — ABO/RH: ABO/RH(D): A POS

## 2019-07-27 LAB — TYPE AND SCREEN
ABO/RH(D): A POS
Antibody Screen: NEGATIVE

## 2019-07-27 LAB — SARS CORONAVIRUS 2 (TAT 6-24 HRS): SARS Coronavirus 2: NEGATIVE

## 2019-07-27 LAB — RPR: RPR Ser Ql: NONREACTIVE

## 2019-07-27 NOTE — MAU Note (Signed)
Asymptomatic, swab collected. Lab here for blood work 

## 2019-07-29 ENCOUNTER — Inpatient Hospital Stay (HOSPITAL_COMMUNITY): Payer: BC Managed Care – PPO | Admitting: Anesthesiology

## 2019-07-29 ENCOUNTER — Encounter (HOSPITAL_COMMUNITY)
Admission: RE | Disposition: A | Discharge status: Home / Self Care | Payer: Self-pay | Source: Home / Self Care | Attending: Obstetrics and Gynecology

## 2019-07-29 ENCOUNTER — Inpatient Hospital Stay (HOSPITAL_COMMUNITY)
Admission: RE | Admit: 2019-07-29 | Discharge: 2019-07-31 | DRG: 788 | Disposition: A | Discharge status: Home / Self Care | Payer: BC Managed Care – PPO | Attending: Obstetrics and Gynecology | Admitting: Obstetrics and Gynecology

## 2019-07-29 ENCOUNTER — Encounter (HOSPITAL_COMMUNITY): Payer: Self-pay | Admitting: Obstetrics and Gynecology

## 2019-07-29 DIAGNOSIS — O3663X0 Maternal care for excessive fetal growth, third trimester, not applicable or unspecified: Secondary | ICD-10-CM | POA: Diagnosis present

## 2019-07-29 DIAGNOSIS — Z3A37 37 weeks gestation of pregnancy: Secondary | ICD-10-CM | POA: Diagnosis not present

## 2019-07-29 DIAGNOSIS — Z20822 Contact with and (suspected) exposure to covid-19: Secondary | ICD-10-CM | POA: Diagnosis present

## 2019-07-29 DIAGNOSIS — O134 Gestational [pregnancy-induced] hypertension without significant proteinuria, complicating childbirth: Secondary | ICD-10-CM | POA: Diagnosis present

## 2019-07-29 DIAGNOSIS — Z98891 History of uterine scar from previous surgery: Secondary | ICD-10-CM

## 2019-07-29 DIAGNOSIS — O99214 Obesity complicating childbirth: Secondary | ICD-10-CM | POA: Diagnosis present

## 2019-07-29 LAB — COMPREHENSIVE METABOLIC PANEL
ALT: 23 U/L (ref 0–44)
AST: 25 U/L (ref 15–41)
Albumin: 2.4 g/dL — ABNORMAL LOW (ref 3.5–5.0)
Alkaline Phosphatase: 171 U/L — ABNORMAL HIGH (ref 38–126)
Anion gap: 11 (ref 5–15)
BUN: <5 mg/dL — ABNORMAL LOW (ref 6–20)
CO2: 21 mmol/L — ABNORMAL LOW (ref 22–32)
Calcium: 8.8 mg/dL — ABNORMAL LOW (ref 8.9–10.3)
Chloride: 106 mmol/L (ref 98–111)
Creatinine, Ser: 0.59 mg/dL (ref 0.44–1.00)
GFR calc Af Amer: >60 mL/min (ref >60)
GFR calc non Af Amer: >60 mL/min (ref >60)
Glucose, Bld: 77 mg/dL (ref 70–99)
Potassium: 3.6 mmol/L (ref 3.5–5.1)
Sodium: 138 mmol/L (ref 135–145)
Total Bilirubin: 1.1 mg/dL (ref 0.3–1.2)
Total Protein: 5.5 g/dL — ABNORMAL LOW (ref 6.5–8.1)

## 2019-07-29 LAB — CBC
HCT: 35.3 % — ABNORMAL LOW (ref 36.0–46.0)
Hemoglobin: 11.4 g/dL — ABNORMAL LOW (ref 12.0–15.0)
MCH: 27.7 pg (ref 26.0–34.0)
MCHC: 32.3 g/dL (ref 30.0–36.0)
MCV: 85.7 fL (ref 80.0–100.0)
Platelets: 254 10*3/uL (ref 150–400)
RBC: 4.12 MIL/uL (ref 3.87–5.11)
RDW: 14.8 % (ref 11.5–15.5)
WBC: 8.4 10*3/uL (ref 4.0–10.5)
nRBC: 0 % (ref 0.0–0.2)

## 2019-07-29 LAB — PROTEIN / CREATININE RATIO, URINE
Creatinine, Urine: 114.38 mg/dL
Protein Creatinine Ratio: 0.29 mg/mg{Cre} — ABNORMAL HIGH (ref 0.00–0.15)
Total Protein, Urine: 33 mg/dL

## 2019-07-29 SURGERY — Surgical Case
Anesthesia: Spinal | Site: Abdomen | Wound class: Clean Contaminated

## 2019-07-29 MED ORDER — LACTATED RINGERS IV SOLN: INTRAVENOUS | Status: DC | PRN

## 2019-07-29 MED ORDER — ONDANSETRON HCL 4 MG/2ML IJ SOLN: 4.0000 mg | Freq: Four times a day (QID) | INTRAMUSCULAR | Status: DC | PRN

## 2019-07-29 MED ORDER — SIMETHICONE 80 MG PO CHEW: 80.0000 mg | CHEWABLE_TABLET | ORAL | Status: DC | PRN

## 2019-07-29 MED ORDER — NALOXONE HCL 0.4 MG/ML IJ SOLN: 0.4000 mg | INTRAMUSCULAR | Status: DC | PRN

## 2019-07-29 MED ORDER — MENTHOL 3 MG MT LOZG: 1.0000 | LOZENGE | OROMUCOSAL | Status: DC | PRN

## 2019-07-29 MED ORDER — FENTANYL CITRATE (PF) 100 MCG/2ML IJ SOLN
INTRAMUSCULAR | Status: AC
  Filled 2019-07-29: qty 2

## 2019-07-29 MED ORDER — COCONUT OIL OIL: 1.0000 "application " | TOPICAL_OIL | Status: DC | PRN

## 2019-07-29 MED ORDER — CEFAZOLIN SODIUM-DEXTROSE 2-4 GM/100ML-% IV SOLN
INTRAVENOUS | Status: AC
  Filled 2019-07-29: qty 100

## 2019-07-29 MED ORDER — SCOPOLAMINE 1 MG/3DAYS TD PT72
1.0000 | MEDICATED_PATCH | Freq: Once | TRANSDERMAL | Status: DC
  Administered 2019-07-29: 1.5 mg via TRANSDERMAL

## 2019-07-29 MED ORDER — SODIUM CHLORIDE 0.9 % IV SOLN: INTRAVENOUS | Status: DC | PRN

## 2019-07-29 MED ORDER — NALBUPHINE HCL 10 MG/ML IJ SOLN
5.0000 mg | INTRAMUSCULAR | Status: DC | PRN
  Administered 2019-07-29: 5 mg via SUBCUTANEOUS

## 2019-07-29 MED ORDER — DIBUCAINE (PERIANAL) 1 % EX OINT: 1.0000 "application " | TOPICAL_OINTMENT | CUTANEOUS | Status: DC | PRN

## 2019-07-29 MED ORDER — SCOPOLAMINE 1 MG/3DAYS TD PT72
MEDICATED_PATCH | TRANSDERMAL | Status: AC
  Filled 2019-07-29: qty 1

## 2019-07-29 MED ORDER — MORPHINE SULFATE (PF) 0.5 MG/ML IJ SOLN
INTRAMUSCULAR | Status: DC | PRN
  Administered 2019-07-29: .15 mg via INTRATHECAL

## 2019-07-29 MED ORDER — NALBUPHINE HCL 10 MG/ML IJ SOLN: 5.0000 mg | INTRAMUSCULAR | Status: DC | PRN

## 2019-07-29 MED ORDER — ZOLPIDEM TARTRATE 5 MG PO TABS: 5.0000 mg | ORAL_TABLET | Freq: Every evening | ORAL | Status: DC | PRN

## 2019-07-29 MED ORDER — PROMETHAZINE HCL 25 MG/ML IJ SOLN
25.0000 mg | Freq: Four times a day (QID) | INTRAMUSCULAR | Status: DC | PRN
  Administered 2019-07-29: 25 mg via INTRAVENOUS
  Filled 2019-07-29: qty 1

## 2019-07-29 MED ORDER — NALBUPHINE HCL 10 MG/ML IJ SOLN: 5.0000 mg | Freq: Once | INTRAMUSCULAR | Status: DC | PRN

## 2019-07-29 MED ORDER — LACTATED RINGERS IV SOLN: INTRAVENOUS | Status: DC

## 2019-07-29 MED ORDER — ACETAMINOPHEN 500 MG PO TABS
1000.0000 mg | ORAL_TABLET | Freq: Four times a day (QID) | ORAL | Status: AC
  Administered 2019-07-29 – 2019-07-30 (×2): 1000 mg via ORAL
  Filled 2019-07-29 (×3): qty 2

## 2019-07-29 MED ORDER — MEPERIDINE HCL 25 MG/ML IJ SOLN: 6.2500 mg | INTRAMUSCULAR | Status: DC | PRN

## 2019-07-29 MED ORDER — DIPHENHYDRAMINE HCL 50 MG/ML IJ SOLN: 12.5000 mg | INTRAMUSCULAR | Status: DC | PRN

## 2019-07-29 MED ORDER — FENTANYL CITRATE (PF) 100 MCG/2ML IJ SOLN
25.0000 ug | INTRAMUSCULAR | Status: DC | PRN
  Administered 2019-07-29: 50 ug via INTRAVENOUS
  Administered 2019-07-29: 25 ug via INTRAVENOUS

## 2019-07-29 MED ORDER — STERILE WATER FOR IRRIGATION IR SOLN
Status: DC | PRN
  Administered 2019-07-29: 1000 mL

## 2019-07-29 MED ORDER — ONDANSETRON HCL 4 MG/2ML IJ SOLN: 4.0000 mg | Freq: Three times a day (TID) | INTRAMUSCULAR | Status: DC | PRN

## 2019-07-29 MED ORDER — KETOROLAC TROMETHAMINE 30 MG/ML IJ SOLN
30.0000 mg | Freq: Four times a day (QID) | INTRAMUSCULAR | Status: AC | PRN
  Administered 2019-07-29: 30 mg via INTRAVENOUS

## 2019-07-29 MED ORDER — CEFAZOLIN SODIUM-DEXTROSE 2-4 GM/100ML-% IV SOLN
2.0000 g | Freq: Once | INTRAVENOUS | Status: AC
  Administered 2019-07-29: 2 g via INTRAVENOUS

## 2019-07-29 MED ORDER — OXYTOCIN-SODIUM CHLORIDE 30-0.9 UT/500ML-% IV SOLN
INTRAVENOUS | Status: DC | PRN
  Administered 2019-07-29: 30 [IU] via INTRAVENOUS

## 2019-07-29 MED ORDER — NALOXONE HCL 4 MG/10ML IJ SOLN
1.0000 ug/kg/h | INTRAVENOUS | Status: DC | PRN
  Filled 2019-07-29: qty 5

## 2019-07-29 MED ORDER — OXYTOCIN-SODIUM CHLORIDE 30-0.9 UT/500ML-% IV SOLN
2.5000 [IU]/h | INTRAVENOUS | Status: AC
  Administered 2019-07-29: 2.5 [IU]/h via INTRAVENOUS
  Filled 2019-07-29: qty 500

## 2019-07-29 MED ORDER — KETOROLAC TROMETHAMINE 30 MG/ML IJ SOLN
INTRAMUSCULAR | Status: AC
  Filled 2019-07-29: qty 1

## 2019-07-29 MED ORDER — ONDANSETRON HCL 4 MG/2ML IJ SOLN
INTRAMUSCULAR | Status: AC
  Filled 2019-07-29: qty 2

## 2019-07-29 MED ORDER — CEFAZOLIN SODIUM-DEXTROSE 2-4 GM/100ML-% IV SOLN: 2.0000 g | INTRAVENOUS | Status: DC

## 2019-07-29 MED ORDER — KETOROLAC TROMETHAMINE 30 MG/ML IJ SOLN: 30.0000 mg | Freq: Four times a day (QID) | INTRAMUSCULAR | Status: AC | PRN

## 2019-07-29 MED ORDER — DIPHENHYDRAMINE HCL 25 MG PO CAPS: 25.0000 mg | ORAL_CAPSULE | Freq: Four times a day (QID) | ORAL | Status: DC | PRN

## 2019-07-29 MED ORDER — NALBUPHINE HCL 10 MG/ML IJ SOLN
5.0000 mg | Freq: Once | INTRAMUSCULAR | Status: DC | PRN
  Filled 2019-07-29: qty 1

## 2019-07-29 MED ORDER — SODIUM CHLORIDE 0.9 % IR SOLN
Status: DC | PRN
  Administered 2019-07-29: 1000 mL

## 2019-07-29 MED ORDER — TETANUS-DIPHTH-ACELL PERTUSSIS 5-2.5-18.5 LF-MCG/0.5 IM SUSP
0.5000 mL | Freq: Once | INTRAMUSCULAR | Status: AC
  Administered 2019-07-31: 0.5 mL via INTRAMUSCULAR
  Filled 2019-07-29: qty 0.5

## 2019-07-29 MED ORDER — HYDROCODONE-ACETAMINOPHEN 5-325 MG PO TABS
1.0000 | ORAL_TABLET | ORAL | Status: DC | PRN
  Administered 2019-07-30 – 2019-07-31 (×5): 1 via ORAL
  Filled 2019-07-29 (×5): qty 1

## 2019-07-29 MED ORDER — PHENYLEPHRINE HCL-NACL 20-0.9 MG/250ML-% IV SOLN
INTRAVENOUS | Status: DC | PRN
  Administered 2019-07-29: 60 ug/min via INTRAVENOUS

## 2019-07-29 MED ORDER — FENTANYL CITRATE (PF) 100 MCG/2ML IJ SOLN
INTRAMUSCULAR | Status: DC | PRN
  Administered 2019-07-29: 15 ug via INTRATHECAL

## 2019-07-29 MED ORDER — PRENATAL MULTIVITAMIN CH
1.0000 | ORAL_TABLET | Freq: Every day | ORAL | Status: DC
  Administered 2019-07-30 – 2019-07-31 (×2): 1 via ORAL
  Filled 2019-07-29 (×2): qty 1

## 2019-07-29 MED ORDER — DIPHENHYDRAMINE HCL 25 MG PO CAPS: 25.0000 mg | ORAL_CAPSULE | ORAL | Status: DC | PRN

## 2019-07-29 MED ORDER — HYDROMORPHONE HCL 1 MG/ML IJ SOLN: 1.0000 mg | INTRAMUSCULAR | Status: DC | PRN

## 2019-07-29 MED ORDER — BUPIVACAINE IN DEXTROSE 0.75-8.25 % IT SOLN
INTRATHECAL | Status: DC | PRN
  Administered 2019-07-29: 1.5 mL via INTRATHECAL

## 2019-07-29 MED ORDER — SIMETHICONE 80 MG PO CHEW
80.0000 mg | CHEWABLE_TABLET | Freq: Three times a day (TID) | ORAL | Status: DC
  Administered 2019-07-30 – 2019-07-31 (×4): 80 mg via ORAL
  Filled 2019-07-29 (×4): qty 1

## 2019-07-29 MED ORDER — WITCH HAZEL-GLYCERIN EX PADS: 1.0000 "application " | MEDICATED_PAD | CUTANEOUS | Status: DC | PRN

## 2019-07-29 MED ORDER — SIMETHICONE 80 MG PO CHEW
80.0000 mg | CHEWABLE_TABLET | ORAL | Status: DC
  Administered 2019-07-29 – 2019-07-30 (×2): 80 mg via ORAL
  Filled 2019-07-29 (×2): qty 1

## 2019-07-29 MED ORDER — SODIUM CHLORIDE 0.9% FLUSH: 3.0000 mL | INTRAVENOUS | Status: DC | PRN

## 2019-07-29 MED ORDER — PHENYLEPHRINE HCL-NACL 20-0.9 MG/250ML-% IV SOLN
INTRAVENOUS | Status: AC
  Filled 2019-07-29: qty 250

## 2019-07-29 MED ORDER — MORPHINE SULFATE (PF) 0.5 MG/ML IJ SOLN
INTRAMUSCULAR | Status: AC
  Filled 2019-07-29: qty 10

## 2019-07-29 MED ORDER — ONDANSETRON HCL 4 MG/2ML IJ SOLN
INTRAMUSCULAR | Status: DC | PRN
  Administered 2019-07-29: 4 mg via INTRAVENOUS

## 2019-07-29 MED ORDER — SENNOSIDES-DOCUSATE SODIUM 8.6-50 MG PO TABS
2.0000 | ORAL_TABLET | ORAL | Status: DC
  Administered 2019-07-29 – 2019-07-30 (×2): 2 via ORAL
  Filled 2019-07-29 (×2): qty 2

## 2019-07-29 SURGICAL SUPPLY — 41 items
BENZOIN TINCTURE PRP APPL 2/3 (GAUZE/BANDAGES/DRESSINGS) ×3 IMPLANT
CHLORAPREP W/TINT 26ML (MISCELLANEOUS) ×3 IMPLANT
CLAMP CORD UMBIL (MISCELLANEOUS) IMPLANT
CLOSURE STERI STRIP 1/2 X4 (GAUZE/BANDAGES/DRESSINGS) ×2 IMPLANT
CLOSURE WOUND 1/2 X4 (GAUZE/BANDAGES/DRESSINGS) ×1
CLOTH BEACON ORANGE TIMEOUT ST (SAFETY) ×3 IMPLANT
DRAIN JACKSON PRT FLT 10 (DRAIN) IMPLANT
DRSG OPSITE POSTOP 4X10 (GAUZE/BANDAGES/DRESSINGS) ×3 IMPLANT
ELECT REM PT RETURN 9FT ADLT (ELECTROSURGICAL) ×3
ELECTRODE REM PT RTRN 9FT ADLT (ELECTROSURGICAL) ×1 IMPLANT
EVACUATOR SILICONE 100CC (DRAIN) IMPLANT
EXTRACTOR VACUUM M CUP 4 TUBE (SUCTIONS) IMPLANT
EXTRACTOR VACUUM M CUP 4' TUBE (SUCTIONS)
GLOVE BIO SURGEON STRL SZ 6.5 (GLOVE) ×2 IMPLANT
GLOVE BIO SURGEONS STRL SZ 6.5 (GLOVE) ×1
GLOVE BIOGEL PI IND STRL 7.0 (GLOVE) ×2 IMPLANT
GLOVE BIOGEL PI INDICATOR 7.0 (GLOVE) ×4
GOWN STRL REUS W/TWL LRG LVL3 (GOWN DISPOSABLE) ×6 IMPLANT
KIT ABG SYR 3ML LUER SLIP (SYRINGE) IMPLANT
NEEDLE HYPO 25X5/8 SAFETYGLIDE (NEEDLE) IMPLANT
NS IRRIG 1000ML POUR BTL (IV SOLUTION) ×3 IMPLANT
PACK C SECTION WH (CUSTOM PROCEDURE TRAY) ×3 IMPLANT
PAD OB MATERNITY 4.3X12.25 (PERSONAL CARE ITEMS) ×3 IMPLANT
PENCIL SMOKE EVAC W/HOLSTER (ELECTROSURGICAL) ×3 IMPLANT
RETRACTOR WND ALEXIS 25 LRG (MISCELLANEOUS) ×1 IMPLANT
RTRCTR C-SECT PINK 25CM LRG (MISCELLANEOUS) IMPLANT
RTRCTR WOUND ALEXIS 25CM LRG (MISCELLANEOUS) ×3
STRIP CLOSURE SKIN 1/2X4 (GAUZE/BANDAGES/DRESSINGS) ×2 IMPLANT
SUT CHROMIC 0 CT 1 (SUTURE) ×3 IMPLANT
SUT MNCRL AB 3-0 PS2 27 (SUTURE) ×3 IMPLANT
SUT PLAIN 2 0 (SUTURE) ×6
SUT PLAIN 2 0 XLH (SUTURE) ×3 IMPLANT
SUT PLAIN ABS 2-0 CT1 27XMFL (SUTURE) ×2 IMPLANT
SUT SILK 2 0 SH (SUTURE) IMPLANT
SUT VIC AB 0 CTX 36 (SUTURE) ×8
SUT VIC AB 0 CTX36XBRD ANBCTRL (SUTURE) ×4 IMPLANT
SUT VIC AB 2-0 SH 27 (SUTURE)
SUT VIC AB 2-0 SH 27XBRD (SUTURE) IMPLANT
TOWEL OR 17X24 6PK STRL BLUE (TOWEL DISPOSABLE) ×3 IMPLANT
TRAY FOLEY W/BAG SLVR 14FR LF (SET/KITS/TRAYS/PACK) ×3 IMPLANT
WATER STERILE IRR 1000ML POUR (IV SOLUTION) ×3 IMPLANT

## 2019-07-29 NOTE — Op Note (Signed)
Cesarean Section Procedure Note   Karen Suarez  07/29/2019  Indications: Scheduled Proceedure/Maternal Request and gestational HTN   Pre-operative Diagnosis: Gestational Hypertension and Large Gestational Age.   Post-operative Diagnosis: Same   Surgeon: Surgeon(s) and Role:    * Jaymes Graff, MD - Primary   Assistants: Verdis Prime CNM   Anesthesia: spinal   Procedure Details:  The patient was seen in the Holding Room. The risks, benefits, complications, treatment options, and expected outcomes were discussed with the patient. The patient concurred with the proposed plan, giving informed consent. identified as Karen Suarez and the procedure verified as C-Section Delivery. A Time Out was held and the above information confirmed.  After induction of anesthesia, the patient was draped and prepped in the usual sterile manner. A transverse incision was made and carried down through the subcutaneous tissue to the fascia. Fascial incision was made in the midline and extended transversely. The fascia was separated from the underlying rectus muscle superiorly and inferiorly. The peritoneum was identified and entered. Peritoneal incision was extended longitudinally with good visualization of bowel and bladder. The utero-vesical peritoneal reflection was incised transversely and the bladder flap was bluntly freed from the lower uterine segment.  An alexsis retractor was placed in the abdomen.   A low transverse uterine incision was made. Delivered from cephalic presentation was a  infant, with Apgar scores of 8 at one minute and 9 at five minutes. Cord ph was not sent the umbilical cord was clamped and cut cord blood was obtained for evaluation. The placenta was removed Intact and appeared normal. The uterine outline, tubes and ovaries appeared normal}. The uterine incision was closed with running locked sutures of 0Vicryl. A second layer 0 vicrlyl was used to imbricate the uterine incision     Hemostasis was observed. Lavage was carried out until clear. The alexsis was removed.  The peritoneum was closed with 0 chromic.  The muscles were examined and any bleeders were made hemostatic using bovie cautery device.   The fascia was then reapproximated with running sutures of 0 vicryl.  The subcutaneous tissue was reapproximated  With interrupted stitches using 2-0 plain gut. The subcuticular closure was performed using 3-77monocryl     Instrument, sponge, and needle counts were correct prior the abdominal closure and were correct at the conclusion of the case.    Findings: infant was delivered from vtx presentation. The fluid was clear.  The uterus tubes and ovaries appeared normal.     Estimated Blood Loss: 760cc   Total IV Fluids:   Urine Output: 100CC OF clear urine  Specimens: placenta to pathology  Complications: no complications  Disposition: PACU - hemodynamically stable.   Maternal Condition: stable   Baby condition / location:  Couplet care / Skin to Skin  Attending Attestation: I performed the procedure.   Signed: Surgeon(s): Jaymes Graff, MD

## 2019-07-29 NOTE — Lactation Note (Signed)
This note was copied from a baby's chart. Lactation Consultation Note  Patient Name: Karen Suarez Fenter SLHTD'S Date: 07/29/2019 Reason for consult: Initial assessment;Primapara;1st time breastfeeding;Early term 39-38.6wks Baby 8hrs old, asleep skin to skin on mom's chest, dad sitting in chair at bedside. Mom's goal is to breast and bottle feed with EBM as long as baby desires. Mom reports difficulty latching baby to breast, states baby is too sleepy, reports baby latched a couple of minutes after birth but has not latched since. Baby too sleepy/ uninterested in the breast, baby weakly sucked on LC gloved finger. LC hand expressed 1 teaspoon colostrum and fed to baby via spoon, baby tolerated well and returned skin to skin with mom. Mom fitted for 44mm nipple shield incase needed d/t flat nipples. Recommended 2 good feedings at the breast prior to circumcision. Advised cue based feedings, wake if last feeding >3hrs, if unable to latch hand express and give colostrum to baby, pump with DEBP. Reviewed newborn behavior in first 24-48hrs, optimal skin to skin and benefits, pumping frequency, breastfeeding brochure with numbers for [facility] support/ outpatient clinic, importance of support system, engorgement and how to manage. Mom voiced understanding and with no further concerns. Left room with mom sitting in bed, baby skin to skin, dad at bedside, RN in to setup breast pump. BGilliam, RN, IBCLC  Maternal Data Formula Feeding for Exclusion: No Has patient been taught Hand Expression?: Yes Does the patient have breastfeeding experience prior to this delivery?: No  Feeding Feeding Type: Breast Milk  LATCH Score Latch: Too sleepy or reluctant, no latch achieved, no sucking elicited.  Audible Swallowing: None  Type of Nipple: Flat  Comfort (Breast/Nipple): Soft / non-tender  Hold (Positioning): Full assist, staff holds infant at breast  LATCH Score: 3  Interventions Interventions: Breast feeding  basics reviewed;Assisted with latch;Skin to skin;Breast massage;Hand express;Adjust position;Support pillows;Position options;Expressed milk;DEBP  Lactation Tools Discussed/Used WIC Program: No Initiated by:: Banker) Date initiated:: 07/29/19   Consult Status Consult Status: Follow-up Date: 07/30/19 Follow-up type: In-patient    Charlynn Court 07/29/2019, 9:36 PM

## 2019-07-29 NOTE — Anesthesia Procedure Notes (Signed)
Spinal  Patient location during procedure: OR Start time: 07/29/2019 12:42 PM End time: 07/29/2019 12:47 PM Staffing Performed: anesthesiologist  Anesthesiologist: Marcene Duos, MD Preanesthetic Checklist Completed: patient identified, IV checked, site marked, risks and benefits discussed, surgical consent, monitors and equipment checked, pre-op evaluation and timeout performed Spinal Block Patient position: sitting Prep: DuraPrep Patient monitoring: heart rate, cardiac monitor, continuous pulse ox and blood pressure Approach: midline Location: L4-5 Injection technique: single-shot Needle Needle type: Pencan  Needle gauge: 24 G Needle length: 9 cm Assessment Sensory level: T4

## 2019-07-29 NOTE — H&P (Signed)
Karen Suarez is a 24 y.o. female presenting for primary CS for Gestational HTN.  Pt denies headache, RUQ pain or visual changes.  She requested CS due to LGA last [place] [id number].  R&B reviewed with the pt in detail . OB History    Gravida  2   Para  0   Term  0   Preterm  0   AB  1   Living  0     SAB  1   TAB  0   Ectopic  0   Multiple  0   Live Births             Past Medical History:  Diagnosis Date  . Complication of anesthesia    hard to wake up  . Family history of adverse reaction to anesthesia    pt's father has hx. of being hard to wake up post-op  . Pregnancy induced hypertension   . Tonsillar and adenoid hypertrophy 01/2015   Past Surgical History:  Procedure Laterality Date  . NO PAST SURGERIES    . TONSILLECTOMY AND ADENOIDECTOMY N/A 01/31/2015   Procedure: TONSILLECTOMY AND ADENOIDECTOMY;  Surgeon: Newman Pies, MD;  Location: [facility];  Service: ENT;  Laterality: N/A;   Family History: family history includes Anesthesia problems in her father; Diabetes in her father, maternal grandfather, and paternal grandfather; Heart disease in her father. Social History:  reports that she has never smoked. She has never used smokeless tobacco. She reports that she does not drink alcohol or use drugs.     Maternal Diabetes: No Genetic Screening: Normal Maternal Ultrasounds/Referrals: Normal Fetal Ultrasounds or other Referrals:  None Maternal Substance Abuse:  No Significant Maternal Medications:  None Significant Maternal Lab Results:  None Other Comments:  None  Review of Systems History   Blood pressure (!) 137/106, pulse (!) 109, temperature 98.2 F (36.8 C), temperature source Oral, resp. rate 18, height 5\' 1"  (1.549 m), weight 108 kg, last menstrual period 10/19/2018, SpO2 97 %. Exam Physical Exam  Physical Examination: General appearance - alert, well appearing, and in no distress Mental status - alert, oriented to person, place,  and time Chest - clear to auscultation, no wheezes, rales or rhonchi, symmetric air entry Heart - normal rate and regular rhythm Abdomen - soft, nontender, nondistended, no masses or organomegaly gravid Extremities - peripheral pulses normal, no pedal edema, no clubbing or cyanosis, Homan's sign negative bilaterally, normal reflexes B Prenatal labs: ABO, Rh: --/--/A POS, A POS Performed at [facility], [address], [place], [place]  [phone] (10/17) Antibody: NEG (06/07 0844) Rubella:   RPR: NON REACTIVE (06/07 0844)  HBsAg:    HIV:    GBS:     Assessment/Plan: 37 weeks with gestational HTN Pt desires CS bc of LGA R&B reviewed She had gestational proteinuria but also had a UTI.  Will check PCR now and urine culture Blood pressure is elevated will monitor closely.   She is asymptomatic   Lamar Meter A Roxie Gueye 07/29/2019, 12:15 PM

## 2019-07-29 NOTE — Anesthesia Preprocedure Evaluation (Signed)
Anesthesia Evaluation  Patient identified by MRN, date of birth, ID band Patient awake    Reviewed: Allergy & Precautions, NPO status , Patient's Chart, lab work & pertinent test results  Airway Mallampati: II  TM Distance: >3 FB     Dental   Pulmonary neg pulmonary ROS,    breath sounds clear to auscultation       Cardiovascular hypertension (GHTN),  Rhythm:Regular Rate:Normal     Neuro/Psych  Headaches,    GI/Hepatic Neg liver ROS, GERD  ,  Endo/Other  Morbid obesity  Renal/GU negative Renal ROS     Musculoskeletal   Abdominal   Peds  Hematology  (+) anemia ,   Anesthesia Other Findings   Reproductive/Obstetrics (+) Pregnancy                             Anesthesia Physical Anesthesia Plan  ASA: III  Anesthesia Plan: Spinal   Post-op Pain Management:    Induction:   PONV Risk Score and Plan: 3 and Scopolamine patch - Pre-op, Dexamethasone, Ondansetron and Treatment may vary due to age or medical condition  Airway Management Planned: Natural Airway  Additional Equipment: None  Intra-op Plan:   Post-operative Plan:   Informed Consent: I have reviewed the patients History and Physical, chart, labs and discussed the procedure including the risks, benefits and alternatives for the proposed anesthesia with the patient or authorized representative who has indicated his/her understanding and acceptance.       Plan Discussed with: CRNA  Anesthesia Plan Comments:         Anesthesia Quick Evaluation

## 2019-07-29 NOTE — Anesthesia Postprocedure Evaluation (Signed)
Anesthesia Post Note  Patient: Radio broadcast assistant  Procedure(s) Performed: Primary CESAREAN SECTION (N/A Abdomen)     Patient location during evaluation: PACU Anesthesia Type: Spinal Level of consciousness: awake and alert Pain management: pain level controlled Vital Signs Assessment: post-procedure vital signs reviewed and stable Respiratory status: spontaneous breathing and respiratory function stable Cardiovascular status: blood pressure returned to baseline and stable Postop Assessment: spinal receding Anesthetic complications: no    Last Vitals:  Vitals:   07/29/19 1445 07/29/19 1500  BP: 127/78 121/78  Pulse: 95 100  Resp: 20 14  Temp:    SpO2: 97% 98%    Last Pain:  Vitals:   07/29/19 1510  TempSrc:   PainSc: 3    Pain Goal: Patients Stated Pain Goal: 4 (07/29/19 1125)  LLE Motor Response: Purposeful movement (07/29/19 1500) LLE Sensation: Tingling (07/29/19 1500) RLE Motor Response: Purposeful movement (07/29/19 1500) RLE Sensation: Tingling (07/29/19 1500)     Epidural/Spinal Function Cutaneous sensation: Tingles (07/29/19 1500), Patient able to flex knees: Yes (07/29/19 1500), Patient able to lift hips off bed: No (07/29/19 1500), Back pain beyond tenderness at insertion site: No (07/29/19 1500), Progressively worsening motor and/or sensory loss: No (07/29/19 1500), Bowel and/or bladder incontinence post epidural: No (07/29/19 1500)  Kennieth Rad

## 2019-07-29 NOTE — Transfer of Care (Signed)
Immediate Anesthesia Transfer of Care Note  Patient: Karen Suarez  Procedure(s) Performed: Primary CESAREAN SECTION (N/A Abdomen)  Patient Location: PACU  Anesthesia Type:Spinal  Level of Consciousness: awake  Airway & Oxygen Therapy: Patient Spontanous Breathing  Post-op Assessment: Report given to RN and Post -op Vital signs reviewed and stable  Post vital signs: Reviewed and stable  Last Vitals:  Vitals Value Taken Time  BP 128/74 07/29/19 1409  Temp    Pulse 105 07/29/19 1412  Resp 9 07/29/19 1412  SpO2 97 % 07/29/19 1412  Vitals shown include unvalidated device data.  Last Pain:  Vitals:   07/29/19 1125  TempSrc: Oral  PainSc: 0-No pain      Patients Stated Pain Goal: 4 (07/29/19 1125)  Complications: No apparent anesthesia complications

## 2019-07-29 NOTE — Progress Notes (Signed)
Pt started experiencing n/v and rating pain in abdomen a 5 out of 10. Pt last had Zofran 4mg  at 1250, spoke to Dr. about pt status, ordered additional medications for nausea and pain (see orders).  Also informed Dr. Normand Sloop about pt BP = 154/98, pt declines any headache or visual changes at this time. Instructed by Dr. Normand Sloop  to continue to monitor and notify provider if BP ranges become in the critical range.  Will continue to monitor.

## 2019-07-30 ENCOUNTER — Encounter (HOSPITAL_COMMUNITY): Payer: Self-pay | Admitting: Obstetrics and Gynecology

## 2019-07-30 DIAGNOSIS — O3663X0 Maternal care for excessive fetal growth, third trimester, not applicable or unspecified: Secondary | ICD-10-CM | POA: Diagnosis present

## 2019-07-30 LAB — CBC
HCT: 33.1 % — ABNORMAL LOW (ref 36.0–46.0)
Hemoglobin: 10.6 g/dL — ABNORMAL LOW (ref 12.0–15.0)
MCH: 27.5 pg (ref 26.0–34.0)
MCHC: 32 g/dL (ref 30.0–36.0)
MCV: 86 fL (ref 80.0–100.0)
Platelets: 236 10*3/uL (ref 150–400)
RBC: 3.85 MIL/uL — ABNORMAL LOW (ref 3.87–5.11)
RDW: 14.8 % (ref 11.5–15.5)
WBC: 9.2 10*3/uL (ref 4.0–10.5)
nRBC: 0 % (ref 0.0–0.2)

## 2019-07-30 MED ORDER — IBUPROFEN 600 MG PO TABS
600.0000 mg | ORAL_TABLET | Freq: Four times a day (QID) | ORAL | Status: DC | PRN
  Administered 2019-07-30 – 2019-07-31 (×5): 600 mg via ORAL
  Filled 2019-07-30 (×5): qty 1

## 2019-07-30 NOTE — Lactation Note (Signed)
This note was copied from a baby's chart. Lactation Consultation Note  Patient Name: Karen Suarez ZHYQM'V Date: 07/30/2019  [name] Karen Suarez now 68 hours old.  Mom reports  she is very discouraged because her plan was to exclusievly breastfeed. Mom reports then she got high blood pressure and he was LGA she she had a csection at 37 weeks.  Mom reports she has tried him again once today to breastfeed both with and without nipple shield and he would not latch. Mom reports not getting much with pumping now either.  Asked if I could assist with breastfeeding.  Parents reported okay it was about time for him to eat anyway.  Mom obese with hx of PCOS. Assisted in breastfeeding in football hold on right breast. Mom sitting up on couch.  Attempted without nipple shield at first.  Nipple will evert but then goes in.  Attempt with 24 mm nipple shield. Able to hand express easily prior to applying nipple shield.  Infant latched in football hold but could not get him latched deep.  Infant continued to dimple.  Parents not sure if he has dimples however could not get a deep latch.  After a few minutes of trying to get him in closer, mouth open wide, asked mom if we could take him off.  Mom agreed.  Breastmilk in nipple shield. Attempt across mom in cross cradle hold on right breast.  After a few attempts infant latched and breastfed well in cross cradle hold. Mom reports comfort.  Some rhythmic sucking and audible swallows heard. Infant let go and came off and seemed content.  Urged mom to try and burp him and offer the other side.  Urged mom to continue to hand express/massage and pump with DEBP and feed him back all emm she gets and formula as prescribed until he is breastfeeding well. Urged to call lactation as needed.   Maternal Data    Feeding    LATCH Score                   Interventions    Lactation Tools Discussed/Used     Consult Status      Karen Suarez 07/30/2019, 6:31  PM

## 2019-07-30 NOTE — Progress Notes (Signed)
Deina Lipsey [id number] Postpartum Postoperative Day # 1  Corrinne Wager, G2P1011, [redacted]w[redacted]d, S/P Primary LT Cesarean Section due to macrosomia.   Subjective: Patient up ad lib, denies syncope or dizziness. Reports consuming regular diet without issues and denies N/V. Patient reports no bowel movement but is passing flatus.  Denies issues with urination and reports bleeding is "much less today."  Patient is breastfeeding and is working with LCs. Undecided for postpartum contraception.  Pain is being appropriately managed with use of Tylenol.   Objective: Vitals with BMI 07/30/2019 07/30/2019 07/30/2019  Height - - -  Weight - - -  BMI - - -  Systolic 119 121 409  Diastolic 86 77 89  Pulse 95 86 -    Physical Exam:  General: alert, cooperative and appears stated age Mood/Affect: appropriate Lungs: clear to auscultation, no wheezes, rales or rhonchi, symmetric air entry.  Heart: normal rate, regular rhythm, normal S1, S2, no murmurs, rubs, clicks or gallops. Breast: breasts appear normal, no suspicious masses, no skin or nipple changes or axillary nodes. Abdomen:  + bowel sounds, soft, non-tender Incision: slight clear drainage present, Honeycomb dressing  Uterine Fundus: firm, involution U-1 Lochia: appropriate Skin: Warm, Dry. DVT Evaluation: No evidence of DVT seen on physical exam.  Labs: Recent Labs    07/29/19 1151 07/30/19 0623  HGB 11.4* 10.6*  HCT 35.3* 33.1*  WBC 8.4 9.2   I/O: I/O last 3 completed shifts: In: 2721.7 [P.O.:480; I.V.:2241.7] Out: 2760 [Urine:2000; Blood:760]   Assessment Postpartum Postoperative Day # 1. Tekeya Manthe, G2P1011, [redacted]w[redacted]d, S/P Primary LT Cesarean Section due to macrosomia.  Pt stable. U-1 Involution. Breastfeeding. Hemodynamically Stable.  Plan: Continue other mgmt as ordered VTE Prophylactics: SCD, ambulated as tolerates.  Pain control: Motrin/Tylenol/Narcotics PRN Anticipate discharge tomorrow.   Dr. Su Hilt to be updated on  patient status  Clancy Gourd, MSN 07/30/2019, 8:31 PM

## 2019-07-31 ENCOUNTER — Ambulatory Visit: Payer: Self-pay

## 2019-07-31 LAB — SURGICAL PATHOLOGY

## 2019-07-31 MED ORDER — ACETAMINOPHEN 325 MG PO TABS
650.0000 mg | ORAL_TABLET | ORAL | 2 refills | Status: DC | PRN
Start: 2019-07-31 — End: 2020-04-14

## 2019-07-31 MED ORDER — HYDROCODONE-ACETAMINOPHEN 5-325 MG PO TABS: 1.0000 | ORAL_TABLET | ORAL | 0 refills | Status: DC | PRN

## 2019-07-31 MED ORDER — IBUPROFEN 600 MG PO TABS: 600.0000 mg | ORAL_TABLET | Freq: Four times a day (QID) | ORAL | 0 refills | Status: DC | PRN

## 2019-07-31 MED ORDER — SENNOSIDES-DOCUSATE SODIUM 8.6-50 MG PO TABS: 2.0000 | ORAL_TABLET | ORAL | Status: DC

## 2019-07-31 MED ORDER — SIMETHICONE 80 MG PO CHEW: 80.0000 mg | CHEWABLE_TABLET | ORAL | 0 refills | Status: DC | PRN

## 2019-07-31 MED ORDER — COCONUT OIL OIL: 1.0000 "application " | TOPICAL_OIL | 0 refills | Status: DC | PRN

## 2019-07-31 NOTE — Discharge Summary (Signed)
OB Discharge Summary  Patient Name: Karen Suarez DOB: 1995/08/13 MRN: [id number]  Date of admission: 07/29/2019 Delivering provider: Crawford Givens   Admitting diagnosis: S/P cesarean section [Z98.891] Large for dates affecting management of mother, third trimester, not applicable or unspecified fetus [O36.63X0] Intrauterine pregnancy: [redacted]w[redacted]d     Secondary diagnosis: Patient Active Problem List   Diagnosis Date Noted  . Postpartum care following cesarean delivery 6/9 07/31/2019  . Large for dates affecting management of mother, third trimester, not applicable or unspecified fetus 07/30/2019  . S/P cesarean section 07/29/2019   Additional problems:gestational hypertension   Date of discharge: 07/31/2019   Discharge diagnosis: Principal Problem:   Postpartum care following cesarean delivery 6/9 Active Problems:   S/P cesarean section   Large for dates affecting management of mother, third trimester, not applicable or unspecified fetus                                                              Post partum procedures:none  Augmentation: N/A Pain control: Spinal  Laceration:None  Episiotomy:None  Complications: None  Hospital course:  Sceduled C/S   24 y.o. yo G2P1011 at [redacted]w[redacted]d was admitted to the hospital 07/29/2019 for scheduled cesarean section with the following indication:Elective Primary and suspect LGA.Delivery details are as follows:  Membrane Rupture Time/Date: 1:14 PM ,07/29/2019   Delivery Method:C-Section, Low Transverse  Details of operation can be found in separate operative note.  Patient had an uncomplicated postpartum course.  She is ambulating, tolerating a regular diet, passing flatus, and urinating well. Patient is discharged home in stable condition on  07/31/19        Newborn Data: Birth date:07/29/2019  Birth time:1:15 PM  Gender:Female  Living status:Living  Apgars:9 ,9  Weight:4050 g     Physical exam  Vitals:   07/30/19 0909 07/30/19 1424 07/30/19 2055  07/31/19 0519  BP: 110/89 121/77 119/86 134/88  Pulse:  86 95 91  Resp: 18 18 16 18   Temp: 98.9 F (37.2 C) 97.7 F (36.5 C) 98.5 F (36.9 C) 98.1 F (36.7 C)  TempSrc: Oral Oral Oral Oral  SpO2: 97% 99% 99% 100%  Weight:      Height:       General: alert, cooperative and no distress Lochia: appropriate Uterine Fundus: firm Incision: Healing well with no significant drainage DVT Evaluation: No cords or calf tenderness. Calf/Ankle edema is present Labs: Lab Results  Component Value Date   WBC 9.2 07/30/2019   HGB 10.6 (L) 07/30/2019   HCT 33.1 (L) 07/30/2019   MCV 86.0 07/30/2019   PLT 236 07/30/2019   CMP Latest Ref Rng & Units 07/29/2019  Glucose 70 - 99 mg/dL 77  BUN 6 - 20 mg/dL <5(L)  Creatinine 0.44 - 1.00 mg/dL 0.59  Sodium 135 - 145 mmol/L 138  Potassium 3.5 - 5.1 mmol/L 3.6  Chloride 98 - 111 mmol/L 106  CO2 22 - 32 mmol/L 21(L)  Calcium 8.9 - 10.3 mg/dL 8.8(L)  Total Protein 6.5 - 8.1 g/dL 5.5(L)  Total Bilirubin 0.3 - 1.2 mg/dL 1.1  Alkaline Phos 38 - 126 U/L 171(H)  AST 15 - 41 U/L 25  ALT 0 - 44 U/L 23   Edinburgh Postnatal Depression Scale Screening Tool 07/31/2019  I have been able to laugh and see  the funny side of things. 0  I have looked forward with enjoyment to things. 0  I have blamed myself unnecessarily when things went wrong. 1  I have been anxious or worried for no good reason. 1  I have felt scared or panicky for no good reason. 0  Things have been getting on top of me. 1  I have been so unhappy that I have had difficulty sleeping. 0  I have felt sad or miserable. 0  I have been so unhappy that I have been crying. 1  The thought of harming myself has occurred to me. 0  Edinburgh Postnatal Depression Scale Total 4   Vaccines: TDaP declined         Flu    declined  Discharge instruction:  per After Visit Summary,  "Understanding Mother & Baby Care" hospital booklet  After Visit Meds:  Allergies as of 07/31/2019   No Known  Allergies     Medication List    TAKE these medications   acetaminophen 325 MG tablet Commonly known as: Tylenol Take 2 tablets (650 mg total) by mouth every 4 (four) hours as needed.   calcium carbonate 500 MG chewable tablet Commonly known as: TUMS - dosed in mg elemental calcium Chew 1 tablet by mouth 3 (three) times daily as needed for indigestion or heartburn.   coconut oil Oil Apply 1 application topically as needed.   HYDROcodone-acetaminophen 5-325 MG tablet Commonly known as: NORCO/VICODIN Take 1-2 tablets by mouth every 4 (four) hours as needed for moderate pain.   ibuprofen 600 MG tablet Commonly known as: ADVIL Take 1 tablet (600 mg total) by mouth every 6 (six) hours as needed for moderate pain.   prenatal multivitamin Tabs tablet Take 1 tablet by mouth daily.   senna-docusate 8.6-50 MG tablet Commonly known as: Senokot-S Take 2 tablets by mouth daily. Start taking on: August 01, 2019   simethicone 80 MG chewable tablet Commonly known as: MYLICON Chew 1 tablet (80 mg total) by mouth as needed for flatulence.       Diet: routine diet  Activity: Advance as tolerated. Pelvic rest for 6 weeks.   Postpartum contraception: desires non-hormonal method, to f/u in office, condoms in interval  Newborn Data: Live born female  Birth Weight: 8 lb 14.9 oz (4050 g) APGAR: 9, 9  Newborn Delivery   Birth date/time: 07/29/2019 13:15:00 Delivery type: C-Section, Low Transverse Trial of labor: No C-section categorization: Primary      named Franky Macho Baby Feeding: Breast Disposition:home with mother   Delivery Report:  Review the Delivery Report for details.    Follow up:  Follow-up Information    Osborn Coho, MD. Go in 6 week(s).   Specialty: Obstetrics and Gynecology Why: [employer] check Contact information: [address] [place] [address] [phone]                 Signed: Cipriano Mile, MSN 07/31/2019, 12:30 PM

## 2019-07-31 NOTE — Lactation Note (Signed)
This note was copied from a baby's chart. Lactation Consultation Note  Patient Name: Karen Suarez HTVGV'S Date: 07/31/2019 Reason for consult: Follow-up assessment   LC arrived for follow up visit and father reports that infant was just finger fed 30 ml of formula., mother reports that when she pumped she obtained only a few drops,.   Mother reports that she has successfully breastfed infant once with a nipple shield., mother reports that she didn't see any colostrum in the shield.,   Discussed importance of offering infant breast before bottle feeding ., Discussed paced bottle feeding ., mother reports that she has a Dr Manson Passey bottle at home.,  Mother to page for [facility] assistance with breastfeeding before giving infant formula,.   Encouraged mother to continue to hand express and to pump every 2-3 hours as well as breast massage., mother receptive to plan.,   Maternal Data    Feeding Feeding Type: Formula  LATCH Score                   Interventions    Lactation Tools Discussed/Used     Consult Status Consult Status: Follow-up Date: 07/31/19 Follow-up type: In-patient    Stevan Born [facility] 07/31/2019, 11:02 AM

## 2019-07-31 NOTE — Lactation Note (Signed)
This note was copied from a baby's chart. Lactation Consultation Note  Patient Name: Karen Suarez EFEOF'H Date: 07/31/2019 Reason for consult: Follow-up assessment   Mother is a P1, infant is 24 hours old , 37 weeks .   Reviewed hand expression with mother. Observed large drops of colostrum. Mother was given a harmony hand pump with instructions. Mothers nipples are erect with compressible breast tissue. No observed trama of mothers nipples.   Mother taught to apply #24 nipple shield. Nipples are flat and shield is not fitting well.  . Mother has a DEBP sat up at the bedside.   Mother was observed with infant latched on at the left breast. Observed infant suckling with audible swallows. Infant sustained latch for 10 mins. Infant was fed 10 mi of formula with a curved tip syringe through the nipple shield.  Infant was given 30 ml of formula by LC and parent. Parents taught to pace bottle feed infant.  Discussed treatment and prevention of engorgement.  Plan of Care : Breastfeed infant with feeding cues Supplement infant with ebm/formula, according to supplemental guidelines. Pump using a DEBP after each feeding for 15-20 mins.   Mother to continue to cue base feed infant and feed at least 8-12 times or more in 24 hours and advised to allow for cluster feeding infant as needed.   Mother to continue to due STS. Mother is aware of available LC services at [facility], [facility], OP Dept, and phone # for questions or concerns about breastfeeding.  Mother receptive to all teaching and plan of care.     Maternal Data    Feeding Feeding Type: Bottle Fed - Formula  LATCH Score Latch: Repeated attempts needed to sustain latch, nipple held in mouth throughout feeding, stimulation needed to elicit sucking reflex.  Audible Swallowing: A few with stimulation  Type of Nipple: Flat  Comfort (Breast/Nipple): Soft / non-tender  Hold (Positioning): Assistance needed to correctly position infant  at breast and maintain latch.  LATCH Score: 6  Interventions    Lactation Tools Discussed/Used Tools: Nipple Shields Nipple shield size: 24   Consult Status Consult Status: Complete    Michel Bickers 07/31/2019, 3:12 PM

## 2019-08-11 ENCOUNTER — Encounter: Payer: Self-pay | Admitting: Family Medicine

## 2019-08-11 ENCOUNTER — Telehealth: Payer: Self-pay | Admitting: *Deleted

## 2019-08-11 ENCOUNTER — Other Ambulatory Visit: Payer: Self-pay

## 2019-08-11 ENCOUNTER — Telehealth (INDEPENDENT_AMBULATORY_CARE_PROVIDER_SITE_OTHER): Payer: BC Managed Care – PPO | Admitting: Family Medicine

## 2019-08-11 DIAGNOSIS — J0100 Acute maxillary sinusitis, unspecified: Secondary | ICD-10-CM

## 2019-08-11 MED ORDER — AMOXICILLIN 500 MG PO CAPS: 500.0000 mg | ORAL_CAPSULE | Freq: Three times a day (TID) | ORAL | 0 refills | Status: DC

## 2019-08-11 MED ORDER — FLUTICASONE PROPIONATE 50 MCG/ACT NA SUSP: 2.0000 | Freq: Every day | NASAL | 0 refills | Status: DC

## 2019-08-11 NOTE — Telephone Encounter (Signed)
[name], melikian are scheduled for a virtual visit with your provider today.    Just as we do with appointments in the office, we must obtain your consent to participate.  Your consent will be active for this visit and any virtual visit you may have with one of our providers in the next 365 days.    If you have a MyChart account, I can also send a copy of this consent to you electronically.  All virtual visits are billed to your insurance company just like a traditional visit in the office.  As this is a virtual visit, video technology does not allow for your provider to perform a traditional examination.  This may limit your provider's ability to fully assess your condition.  If your provider identifies any concerns that need to be evaluated in person or the need to arrange testing such as labs, EKG, etc, we will make arrangements to do so.    Although advances in technology are sophisticated, we cannot ensure that it will always work on either your end or our end.  If the connection with a video visit is poor, we may have to switch to a telephone visit.  With either a video or telephone visit, we are not always able to ensure that we have a secure connection.   I need to obtain your verbal consent now.   Are you willing to proceed with your visit today?   Karen Suarez has provided verbal consent on 08/11/2019 for a virtual visit (video or telephone).   [name], RN 08/11/2019  10:57 AM

## 2019-08-11 NOTE — Progress Notes (Signed)
Patient ID: Karen Suarez, female    DOB: 1995/12/03, 24 y.o.   MRN: [id number]   Virtual Visit via Video Note  I connected with Karen Suarez on 08/11/19 at  1:10 PM EDT by a video enabled telemedicine application and verified that I am speaking with the correct person using two identifiers.  Location: Patient: home Provider: office   I discussed the limitations of evaluation and management by telemedicine and the availability of in person appointments. The patient expressed understanding and agreed to proceed.   Chief Complaint  Patient presents with  . Sinus Problem    since last Friday- had C section 2 weeks ago   Subjective:    HPI  Pt stating having greenish sinus drainage.  Going through throat. Severe headache for past few days.  Some dizziness. Going on 4 days ago.  H/o c-section 2 wks ago and is breast feeding.   Went to ob/gyn last week due to dizziness.  H/o htn with pregnancy. Drink gatorade and not helping,  Coughing up drainage.  Had temp 3 days ago, slightly.   Ibuprofen, claritin D, alka seltzer- cold/flu, took it 4am yesterday. Pt wasn't sure what to take otc and has been pumping and dumping the breast milk since being on alka seltzer.  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   Outpatient Encounter Medications as of 08/11/2019  Medication Sig  . acetaminophen (TYLENOL) 325 MG tablet Take 2 tablets (650 mg total) by mouth every 4 (four) hours as needed.  Marland Kitchen amoxicillin (AMOXIL) 500 MG capsule Take 1 capsule (500 mg total) by mouth 3 (three) times daily.  . calcium carbonate (TUMS - DOSED IN MG ELEMENTAL CALCIUM) 500 MG chewable tablet Chew 1 tablet by mouth 3 (three) times daily as needed for indigestion or heartburn.  . coconut oil OIL Apply 1 application topically as needed.  .  fluticasone (FLONASE) 50 MCG/ACT nasal spray Place 2 sprays into both nostrils daily.  Marland Kitchen HYDROcodone-acetaminophen (NORCO/VICODIN) 5-325 MG tablet Take 1-2 tablets by mouth every 4 (four) hours as needed for moderate pain.  Marland Kitchen ibuprofen (ADVIL) 600 MG tablet Take 1 tablet (600 mg total) by mouth every 6 (six) hours as needed for moderate pain.  . Prenatal Vit-Fe Fumarate-FA (PRENATAL MULTIVITAMIN) TABS tablet Take 1 tablet by mouth daily.   Marland Kitchen senna-docusate (SENOKOT-S) 8.6-50 MG tablet Take 2 tablets by mouth daily.  . simethicone (MYLICON) 80 MG chewable tablet Chew 1 tablet (80 mg total) by mouth as needed for flatulence.   No facility-administered encounter medications on file as of 08/11/2019.     Review of Systems  Constitutional: Negative for chills and fever.  HENT: Positive for congestion, postnasal drip, sinus pressure, sinus pain and sore throat. Negative for nosebleeds and rhinorrhea.   Respiratory: Positive for cough. Negative for shortness of breath and wheezing.   Cardiovascular: Negative for chest pain and leg swelling.  Gastrointestinal: Negative for abdominal pain, diarrhea, nausea and vomiting.  Genitourinary: Negative for dysuria and frequency.  Musculoskeletal: Negative for arthralgias and back pain.  Skin: Negative for rash.  Neurological: Positive for dizziness and headaches. Negative for weakness.     Vitals There were no vitals taken for this visit.  Objective:   Physical Exam  No PE due to phone visit.  Assessment and Plan   1. Acute non-recurrent maxillary sinusitis - amoxicillin (AMOXIL) 500 MG capsule;  Take 1 capsule (500 mg total) by mouth 3 (three) times daily.  Dispense: 30 capsule; Refill: 0 - fluticasone (FLONASE) 50 MCG/ACT nasal spray; Place 2 sprays into both nostrils daily.  Dispense: 16 g; Refill: 0   Reviewed with pt URI viral vs. Sinusitis.  Gave amoxicillin as watch and wait script.  Discussed usual course of viral vs. Bacterial  infections.  Pt is breastfeeding.  flonase and amoxicillin safe in lactation/pregnancy. Advising ot use this and sinus rinses to help with sinues.  Take tylenol/ibuporfne.  Avoid decongestants.   Pt in agreement.  F/u 3-5 days if not improving.    Follow Up Instructions:    I discussed the assessment and treatment plan with the patient. The patient was provided an opportunity to ask questions and all were answered. The patient agreed with the plan and demonstrated an understanding of the instructions.   The patient was advised to call back or seek an in-person evaluation if the symptoms worsen or if the condition fails to improve as anticipated.  I provided 15 minutes of non-face-to-face time during this encounter.

## 2019-09-02 ENCOUNTER — Other Ambulatory Visit: Payer: Self-pay | Admitting: Family Medicine

## 2019-09-02 DIAGNOSIS — J0100 Acute maxillary sinusitis, unspecified: Secondary | ICD-10-CM

## 2020-03-09 ENCOUNTER — Other Ambulatory Visit: Payer: Self-pay | Admitting: Family Medicine

## 2020-03-09 DIAGNOSIS — J0100 Acute maxillary sinusitis, unspecified: Secondary | ICD-10-CM

## 2020-03-10 ENCOUNTER — Other Ambulatory Visit: Payer: Self-pay

## 2020-03-10 ENCOUNTER — Other Ambulatory Visit: Payer: BC Managed Care – PPO

## 2020-03-10 ENCOUNTER — Ambulatory Visit: Payer: BC Managed Care – PPO | Admitting: Family Medicine

## 2020-03-10 DIAGNOSIS — Z20822 Contact with and (suspected) exposure to covid-19: Secondary | ICD-10-CM

## 2020-03-12 LAB — SARS-COV-2, NAA 2 DAY TAT

## 2020-03-12 LAB — NOVEL CORONAVIRUS, NAA: SARS-CoV-2, NAA: DETECTED — AB

## 2020-03-29 ENCOUNTER — Telehealth: Payer: BC Managed Care – PPO | Admitting: Family Medicine

## 2020-03-29 ENCOUNTER — Other Ambulatory Visit: Payer: Self-pay

## 2020-03-29 ENCOUNTER — Encounter: Payer: Self-pay | Admitting: Family Medicine

## 2020-03-29 DIAGNOSIS — J019 Acute sinusitis, unspecified: Secondary | ICD-10-CM | POA: Diagnosis not present

## 2020-03-29 MED ORDER — AMOXICILLIN-POT CLAVULANATE 875-125 MG PO TABS: 1.0000 | ORAL_TABLET | Freq: Two times a day (BID) | ORAL | 0 refills | Status: DC

## 2020-03-29 NOTE — Progress Notes (Signed)
Patient ID: Karen Suarez, female    DOB: 15-May-1995, 25 y.o.   MRN: [id number]   Chief Complaint  Patient presents with  . Sinus Problem    Covid positive 03/01/20- having lingering sinus congestion and headache-mucus is now green   Subjective:  CC: sinus congestion  This is not a new problem.  Presents today via telephone visit.  Tested positive for Covid on March 01, 2020, still having lingering sinus congestion with green mucus.  Has tried over-the-counter medications symptoms are lingering.  Feels as though she has a sinus infection.  Denies fever, chills, shortness of breath.  Reports nasal congestion, and pain under the eyes.    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   Outpatient Encounter Medications as of 03/29/2020  Medication Sig  . amoxicillin-clavulanate (AUGMENTIN) 875-125 MG tablet Take 1 tablet by mouth 2 (two) times daily.  Marland Kitchen acetaminophen (TYLENOL) 325 MG tablet Take 2 tablets (650 mg total) by mouth every 4 (four) hours as needed.  . calcium carbonate (TUMS - DOSED IN MG ELEMENTAL CALCIUM) 500 MG chewable tablet Chew 1 tablet by mouth 3 (three) times daily as needed for indigestion or heartburn.  . coconut oil OIL Apply 1 application topically as needed.  . fluticasone (FLONASE) 50 MCG/ACT nasal spray SPRAY 2 SPRAYS INTO EACH NOSTRIL EVERY DAY  . HYDROcodone-acetaminophen (NORCO/VICODIN) 5-325 MG tablet Take 1-2 tablets by mouth every 4 (four) hours as needed for moderate pain. (Patient not taking: Reported on 03/29/2020)  . ibuprofen (ADVIL) 600 MG tablet Take 1 tablet (600 mg total) by mouth every 6 (six) hours as needed for moderate pain.  . Prenatal Vit-Fe Fumarate-FA (PRENATAL MULTIVITAMIN) TABS tablet Take 1 tablet by mouth daily.   Marland Kitchen senna-docusate (SENOKOT-S) 8.6-50 MG tablet Take 2  tablets by mouth daily.  . simethicone (MYLICON) 80 MG chewable tablet Chew 1 tablet (80 mg total) by mouth as needed for flatulence.  . [DISCONTINUED] amoxicillin (AMOXIL) 500 MG capsule Take 1 capsule (500 mg total) by mouth 3 (three) times daily. (Patient not taking: Reported on 03/29/2020)   No facility-administered encounter medications on file as of 03/29/2020.     Review of Systems  Constitutional: Negative for chills, fatigue and fever.  HENT: Positive for congestion, rhinorrhea, sinus pressure and sinus pain. Negative for postnasal drip and sore throat.   Respiratory: Positive for cough. Negative for chest tightness and shortness of breath.        Lingering from covid  Gastrointestinal: Negative for abdominal pain, diarrhea, nausea and vomiting.  Musculoskeletal: Negative for myalgias.  Neurological: Positive for headaches.       For past week, 6-8/10. Ibuprofen helps.     Vitals There were no vitals taken for this visit. Unable. Objective:   Physical Exam  Unable, able to converse throughout telephone visit without obvious shortness of breath. Assessment and Plan   1. Acute rhinosinusitis - amoxicillin-clavulanate (AUGMENTIN) 875-125 MG tablet; Take 1 tablet by mouth 2 (two) times daily.  Dispense: 20 tablet; Refill: 0   Discussed viral versus bacterial infections.  Will treat for acute rhinosinusitis with antibiotics for 10 days.  Recommend saline nasal flushes, and humidification to clean out sinus passages.  Has recovered from recent Covid infection, lingering sinus congestion.  Agrees with plan of care discussed today. Understands warning signs to seek further care: chest pain, shortness of breath, any significant  change in health.  Understands to follow-up if symptoms do not improve, or worsen.  If symptoms persist, recommend in person visit for a physical exam.    Virtual Visit via Telephone Note  I connected with Karen Suarez on 03/29/20 at 11:20 AM EST by  telephone and verified that I am speaking with the correct person using two identifiers.  Location: Patient: home Provider: office   I discussed the limitations, risks, security and privacy concerns of performing an evaluation and management service by telephone and the availability of in person appointments. I also discussed with the patient that there may be a patient responsible charge related to this service. The patient expressed understanding and agreed to proceed.   History of Present Illness:    Observations/Objective:   Assessment and Plan:   Follow Up Instructions:    I discussed the assessment and treatment plan with the patient. The patient was provided an opportunity to ask questions and all were answered. The patient agreed with the plan and demonstrated an understanding of the instructions.   The patient was advised to call back or seek an in-person evaluation if the symptoms worsen or if the condition fails to improve as anticipated.  I provided 6 minutes of non-face-to-face time during this encounter.    Dorena Bodo, NP 03/29/20

## 2020-03-29 NOTE — Patient Instructions (Signed)

## 2020-04-14 ENCOUNTER — Other Ambulatory Visit: Payer: Self-pay

## 2020-04-14 ENCOUNTER — Encounter: Payer: BC Managed Care – PPO | Admitting: Family Medicine

## 2020-04-14 NOTE — Progress Notes (Signed)
Pt had phone visit on 03/29/20. COVID in January and lingering sinus infection. Prescribed Amoxicillin. Sinus pressure under eyes, contanstly blowing nose (green/yellow mucus). Pt finishing antibiotic 2 days ago and symptoms came back. Also having headache.   Virtual Visit via Telephone Note  I connected with Karen Suarez on 04/14/20 at  1:10 PM EST by telephone and verified that I am speaking with the correct person using two identifiers.  Location: Patient: home Provider: office   I discussed the limitations, risks, security and privacy concerns of performing an evaluation and management service by telephone and the availability of in person appointments. I also discussed with the patient that there may be a patient responsible charge related to this service. The patient expressed understanding and agreed to proceed.   History of Present Illness:    Observations/Objective:   Assessment and Plan:   Follow Up Instructions:    I discussed the assessment and treatment plan with the patient. The patient was provided an opportunity to ask questions and all were answered. The patient agreed with the plan and demonstrated an understanding of the instructions.   The patient was advised to call back or seek an in-person evaluation if the symptoms worsen or if the condition fails to improve as anticipated.  I provided *** minutes of non-face-to-face time during this encounter.     Patient ID: Karen Suarez, female    DOB: Nov 02, 1995, 25 y.o.   MRN: [id number]   Chief Complaint  Patient presents with  . 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   Outpatient Encounter Medications as of 04/14/2020  Medication Sig  . fluticasone (FLONASE) 50 MCG/ACT nasal spray SPRAY 2 SPRAYS  INTO EACH NOSTRIL EVERY DAY  . [DISCONTINUED] acetaminophen (TYLENOL) 325 MG tablet Take 2 tablets (650 mg total) by mouth every 4 (four) hours as needed.  . [DISCONTINUED] amoxicillin-clavulanate (AUGMENTIN) 875-125 MG tablet Take 1 tablet by mouth 2 (two) times daily.  . [DISCONTINUED] calcium carbonate (TUMS - DOSED IN MG ELEMENTAL CALCIUM) 500 MG chewable tablet Chew 1 tablet by mouth 3 (three) times daily as needed for indigestion or heartburn.  . [DISCONTINUED] coconut oil OIL Apply 1 application topically as needed.  . [DISCONTINUED] HYDROcodone-acetaminophen (NORCO/VICODIN) 5-325 MG tablet Take 1-2 tablets by mouth every 4 (four) hours as needed for moderate pain. (Patient not taking: Reported on 03/29/2020)  . [DISCONTINUED] ibuprofen (ADVIL) 600 MG tablet Take 1 tablet (600 mg total) by mouth every 6 (six) hours as needed for moderate pain.  . [DISCONTINUED] Prenatal Vit-Fe Fumarate-FA (PRENATAL MULTIVITAMIN) TABS tablet Take 1 tablet by mouth daily.   . [DISCONTINUED] senna-docusate (SENOKOT-S) 8.6-50 MG tablet Take 2 tablets by mouth daily.  . [DISCONTINUED] simethicone (MYLICON) 80 MG chewable tablet Chew 1 tablet (80 mg total) by mouth as needed for flatulence.   No facility-administered encounter medications on file as of 04/14/2020.     Review of Systems   Vitals There were no vitals taken for this visit.  Objective:   Physical Exam   Assessment and Plan   There are no diagnoses linked to this encounter.     Marlowe Shores, LPN 2/84/1324

## 2020-04-18 ENCOUNTER — Ambulatory Visit (INDEPENDENT_AMBULATORY_CARE_PROVIDER_SITE_OTHER): Payer: BC Managed Care – PPO | Admitting: Family Medicine

## 2020-04-18 ENCOUNTER — Encounter: Payer: Self-pay | Admitting: Family Medicine

## 2020-04-18 ENCOUNTER — Other Ambulatory Visit: Payer: Self-pay

## 2020-04-18 VITALS — HR 87 | Temp 98.6°F | Resp 16

## 2020-04-18 DIAGNOSIS — J329 Chronic sinusitis, unspecified: Secondary | ICD-10-CM | POA: Diagnosis not present

## 2020-04-18 MED ORDER — DOXYCYCLINE HYCLATE 100 MG PO TABS: 100.0000 mg | ORAL_TABLET | Freq: Two times a day (BID) | ORAL | 0 refills | Status: DC

## 2020-04-18 NOTE — Progress Notes (Signed)
Patient ID: Karen Suarez, female    DOB: October 22, 1995, 25 y.o.   MRN: [id number]   Chief Complaint  Patient presents with  . Cough    Yellow mucus and headache continues- had televisit 03/29/20 Finished antibiotic 2 weeks ago- never went away   Subjective:  CC: cough, sinus pressure (lingering since Covid infection in January)  This is not a new problem.  Presents today for an acute visit with complaint of cough that comes and goes, and sinus pressure.  Reports that she is having to blow her nose frequently, mucus is green to yellow in color.  Reports that she had Covid infection in January, and symptoms of the sinus issue has been lingering since then.  She has tried humidifier, allergy medication, Flonase nasal spray and trying to clean out her sinus passages regularly.  She denies fever, chills.  Positive for cough worse in the morning, and occasional headache due to sinus pain and pressure.  She completed her antibiotic prescribed in February, she was initially seen February 8 via telephone visit for this sinus infection.    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   Outpatient Encounter Medications as of 04/18/2020  Medication Sig  . doxycycline (VIBRA-TABS) 100 MG tablet Take 1 tablet (100 mg total) by mouth 2 (two) times daily.  . fluticasone (FLONASE) 50 MCG/ACT nasal spray SPRAY 2 SPRAYS INTO EACH NOSTRIL EVERY DAY   No facility-administered encounter medications on file as of 04/18/2020.     Review of Systems  Constitutional: Negative for chills and fever.  HENT: Positive for ear pain, sinus pressure and sinus pain.   Respiratory: Positive for cough.        Cough comes and goes- worse in morning.   Neurological: Positive for headaches.     Vitals Pulse 87   Temp 98.6 F (37 C)   Resp 16   SpO2  98%   Objective:   Physical Exam Vitals reviewed.  Constitutional:      Appearance: Normal appearance.  HENT:     Right Ear: Tympanic membrane normal.     Left Ear: Tympanic membrane normal. There is mastoid tenderness.     Nose: Rhinorrhea present.     Left Turbinates: Swollen.     Right Sinus: Maxillary sinus tenderness and frontal sinus tenderness present.     Left Sinus: Maxillary sinus tenderness and frontal sinus tenderness present.     Mouth/Throat:     Mouth: Mucous membranes are moist.  Cardiovascular:     Rate and Rhythm: Normal rate and regular rhythm.     Heart sounds: Normal heart sounds.  Pulmonary:     Effort: Pulmonary effort is normal.     Breath sounds: Normal breath sounds.  Skin:    General: Skin is warm and dry.  Neurological:     General: No focal deficit present.     Mental Status: She is alert.  Psychiatric:        Behavior: Behavior normal.      Assessment and Plan   1. Recurrent rhinosinusitis - doxycycline (VIBRA-TABS) 100 MG tablet; Take 1 tablet (100 mg total) by mouth 2 (two) times daily.  Dispense: 20 tablet; Refill: 0 - Ambulatory referral to ENT   Will treat recurrent sinus infection with doxycycline for 10 days.  Will send ENT referral for further evaluation.  Encouraged to continue saline  flushes, and humidification to clean out sinus passages.  Agrees with plan of care discussed today. Understands warning signs to seek further care: chest pain, shortness of breath, any significant change in health.  Understands to follow-up at this office as needed, will send ENT referral today.  Dorena Bodo, NP 04/18/20

## 2020-04-18 NOTE — Patient Instructions (Signed)
Sinusitis, Adult Sinusitis is soreness and swelling (inflammation) of your sinuses. Sinuses are hollow spaces in the bones around your face. They are located:  Around your eyes.  In the middle of your forehead.  Behind your nose.  In your cheekbones. Your sinuses and nasal passages are lined with a fluid called mucus. Mucus drains out of your sinuses. Swelling can trap mucus in your sinuses. This lets germs (bacteria, virus, or fungus) grow, which leads to infection. Most of the time, this condition is caused by a virus. What are the causes? This condition is caused by:  Allergies.  Asthma.  Germs.  Things that block your nose or sinuses.  Growths in the nose (nasal polyps).  Chemicals or irritants in the air.  Fungus (rare). What increases the risk? You are more likely to develop this condition if:  You have a weak body defense system (immune system).  You do a lot of swimming or diving.  You use nasal sprays too much.  You smoke. What are the signs or symptoms? The main symptoms of this condition are pain and a feeling of pressure around the sinuses. Other symptoms include:  Stuffy nose (congestion).  Runny nose (drainage).  Swelling and warmth in the sinuses.  Headache.  Toothache.  A cough that may get worse at night.  Mucus that collects in the throat or the back of the nose (postnasal drip).  Being unable to smell and taste.  Being very tired (fatigue).  A fever.  Sore throat.  Bad breath. How is this diagnosed? This condition is diagnosed based on:  Your symptoms.  Your medical history.  A physical exam.  Tests to find out if your condition is short-term (acute) or long-term (chronic). Your doctor may: ? Check your nose for growths (polyps). ? Check your sinuses using a tool that has a light (endoscope). ? Check for allergies or germs. ? Do imaging tests, such as an MRI or CT scan. How is this treated? Treatment for this condition  depends on the cause and whether it is short-term or long-term.  If caused by a virus, your symptoms should go away on their own within 10 days. You may be given medicines to relieve symptoms. They include: ? Medicines that shrink swollen tissue in the nose. ? Medicines that treat allergies (antihistamines). ? A spray that treats swelling of the nostrils. ? Rinses that help get rid of thick mucus in your nose (nasal saline washes).  If caused by bacteria, your doctor may wait to see if you will get better without treatment. You may be given antibiotic medicine if you have: ? A very bad infection. ? A weak body defense system.  If caused by growths in the nose, you may need to have surgery. Follow these instructions at home: Medicines  Take, use, or apply over-the-counter and prescription medicines only as told by your doctor. These may include nasal sprays.  If you were prescribed an antibiotic medicine, take it as told by your doctor. Do not stop taking the antibiotic even if you start to feel better. Hydrate and humidify  Drink enough water to keep your pee (urine) pale yellow.  Use a cool mist humidifier to keep the humidity level in your home above 50%.  Breathe in steam for 10-15 minutes, 3-4 times a day, or as told by your doctor. You can do this in the bathroom while a hot shower is running.  Try not to spend time in cool or dry air.     Rest  Rest as much as you can.  Sleep with your head raised (elevated).  Make sure you get enough sleep each night. General instructions  Put a warm, moist washcloth on your face 3-4 times a day, or as often as told by your doctor. This will help with discomfort.  Wash your hands often with soap and water. If there is no soap and water, use hand sanitizer.  Do not smoke. Avoid being around people who are smoking (secondhand smoke).  Keep all follow-up visits as told by your doctor. This is important.   Contact a doctor if:  You  have a fever.  Your symptoms get worse.  Your symptoms do not get better within 10 days. Get help right away if:  You have a very bad headache.  You cannot stop throwing up (vomiting).  You have very bad pain or swelling around your face or eyes.  You have trouble seeing.  You feel confused.  Your neck is stiff.  You have trouble breathing. Summary  Sinusitis is swelling of your sinuses. Sinuses are hollow spaces in the bones around your face.  This condition is caused by tissues in your nose that become inflamed or swollen. This traps germs. These can lead to infection.  If you were prescribed an antibiotic medicine, take it as told by your doctor. Do not stop taking it even if you start to feel better.  Keep all follow-up visits as told by your doctor. This is important. This information is not intended to replace advice given to you by your health care provider. Make sure you discuss any questions you have with your health care provider. Document Revised: 07/08/2017 Document Reviewed: 07/08/2017 Elsevier Patient Education  2021 [employer]. How to Perform a Sinus Rinse A sinus rinse is a home treatment. It rinses your sinuses with a mixture of salt and water (saline solution). Sinuses are air-filled spaces in your skull behind the bones of your face and forehead. They open into your nasal cavity. A sinus rinse can help to clear your nasal cavity. It can clear mucus, dirt, dust, or pollen. You may do a sinus rinse when you have:  A cold.  A virus.  Allergies.  A sinus infection.  A stuffy nose. Talk with your doctor about whether a sinus rinse might help you. What are the risks? A sinus rinse is normally very safe and helpful. However, there are a few risks. These include:  A burning feeling in the sinuses. This may happen if you do not make the saline solution as instructed. Be sure to follow all directions when making the saline solution.  Nasal  irritation.  Infection from unclean water. This is rare, but possible. Do not do a sinus rinse if you have had:  Ear or nasal surgery.  An ear infection.  Blocked ears. Supplies needed:  Saline solution or powder.  Distilled or germ-free (sterile) water may be needed to mix with saline powder. ? You may use boiled and cooled tap water. Boil tap water for 5 minutes; cool until it is lukewarm. Use within 24 hours. ? Do not use regular tap water to mix with the saline solution.  Neti pot or nasal rinse bottle. This releases the saline solution into your nose and through your sinuses. You can buy neti pots and rinse bottles: ? At your local pharmacy. ? At a health food store. ? Online. How to perform a sinus rinse 1. Wash your hands with soap and water.   2. Wash your device using the directions that came with it. 3. Dry your device. 4. Use the solution that comes with your device or one that is sold separately in stores. Follow the mixing directions on the package if you need to mix with sterile or distilled water. 5. Fill your device with the amount of saline solution stated in the device instructions. 6. Stand over a sink and tilt your head sideways over the sink. 7. Place the spout of the device in your upper nostril (the one closer to the ceiling). 8. Gently pour or squeeze the saline solution into your nasal cavity. The liquid should drain to your lower nostril if you are not too stuffed up (congested). 9. While rinsing, breathe through your open mouth. 10. Gently blow your nose to clear any mucus and rinse solution. Blowing too hard may cause ear pain. 11. Repeat in your other nostril. 12. Clean and rinse your device with clean water. 13. Air-dry your device. Talk with your doctor or pharmacist if you have questions about how to do a sinus rinse.   Summary  A sinus rinse is a home treatment. It rinses your sinuses with a mixture of salt and water (saline solution).  A sinus  rinse is normally very safe and helpful. Follow all instructions carefully.  Talk with your doctor about whether a sinus rinse might help you. This information is not intended to replace advice given to you by your health care provider. Make sure you discuss any questions you have with your health care provider. Document Revised: 11/17/2019 Document Reviewed: 11/17/2019 Elsevier Patient Education  2021 Elsevier Inc.  

## 2020-05-27 ENCOUNTER — Telehealth: Payer: BC Managed Care – PPO | Admitting: Physician Assistant

## 2020-05-27 DIAGNOSIS — J019 Acute sinusitis, unspecified: Secondary | ICD-10-CM | POA: Diagnosis not present

## 2020-05-27 DIAGNOSIS — B9689 Other specified bacterial agents as the cause of diseases classified elsewhere: Secondary | ICD-10-CM

## 2020-05-27 MED ORDER — AMOXICILLIN-POT CLAVULANATE 875-125 MG PO TABS: 1.0000 | ORAL_TABLET | Freq: Two times a day (BID) | ORAL | 0 refills | Status: DC

## 2020-05-27 NOTE — Progress Notes (Signed)
We are sorry that you are not feeling well.  Here is how we plan to help!  Based on what you have shared with me it looks like you have sinusitis.  Sinusitis is inflammation and infection in the sinus cavities of the head.  Based on your presentation and history of significant sinus issues I believe you most likely have Acute Bacterial Sinusitis.  This is an infection caused by bacteria and is treated with antibiotics. I have prescribed Augmentin 875mg /125mg  one tablet twice daily with food, for 7 days. You may use an oral decongestant such as Mucinex D or if you have glaucoma or high blood pressure use plain Mucinex. Saline nasal spray help and can safely be used as often as needed for congestion.  If you develop worsening sinus pain, fever or notice severe headache and vision changes, or if symptoms are not better after completion of antibiotic, please schedule an appointment with a health care provider.    Sinus infections are not as easily transmitted as other respiratory infection, however we still recommend that you avoid close contact with loved ones, especially the very young and elderly.  Remember to wash your hands thoroughly throughout the day as this is the number one way to prevent the spread of infection!  Home Care:  Only take medications as instructed by your medical team.  Complete the entire course of an antibiotic.  Do not take these medications with alcohol.  A steam or ultrasonic humidifier can help congestion.  You can place a towel over your head and breathe in the steam from hot water coming from a faucet.  Avoid close contacts especially the very young and the elderly.  Cover your mouth when you cough or sneeze.  Always remember to wash your hands.  Get Help Right Away If:  You develop worsening fever or sinus pain.  You develop a severe head ache or visual changes.  Your symptoms persist after you have completed your treatment plan.  Make sure you  Understand  these instructions.  Will watch your condition.  Will get help right away if you are not doing well or get worse.  Your e-visit answers were reviewed by a board certified advanced clinical practitioner to complete your personal care plan.  Depending on the condition, your plan could have included both over the counter or prescription medications.  If there is a problem please reply  once you have received a response from your provider.  Your safety is important to .  If you have drug allergies check your prescription carefully.    You can use MyChart to ask questions about today's visit, request a non-urgent call back, or ask for a work or school excuse for 24 hours related to this e-Visit. If it has been greater than 24 hours you will need to follow up with your provider, or enter a new e-Visit to address those concerns.  You will get an e-mail in the next two days asking about your experience.  I hope that your e-visit has been valuable and will speed your recovery. Thank you for using e-visits.

## 2020-05-27 NOTE — Progress Notes (Signed)
I have spent 5 minutes in review of e-visit questionnaire, review and updating patient chart, medical decision making and response to patient.   Pattricia Weiher Cody Torey Regan, PA-C    

## 2020-09-10 IMAGING — US RIGHT LOWER EXTREMITY VENOUS ULTRASOUND
1 series · 13 of 24 positions shown · non-contrast
Comparison: None.

CLINICAL DATA: Right lower extremity pain for the past 3 days.
Patient is currently pregnant. Evaluate for DVT.



[Series 1: right lower extremity venous ultrasound · 13 of 35 slices shown]
[im 1/35]
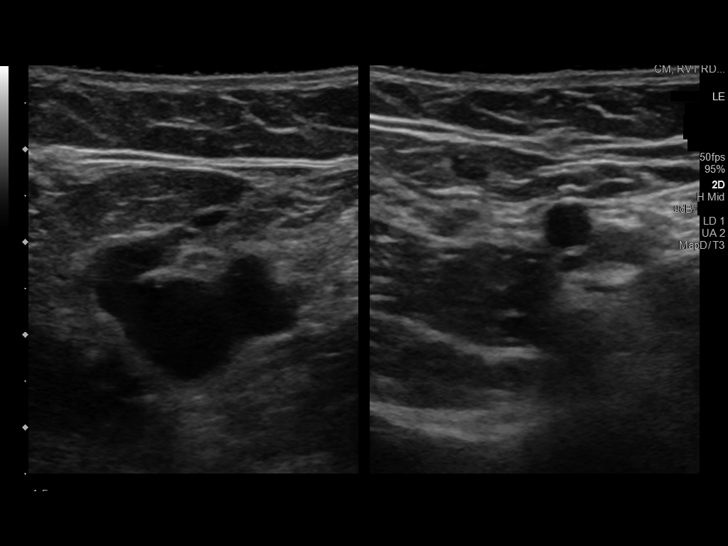
[im 3/35]
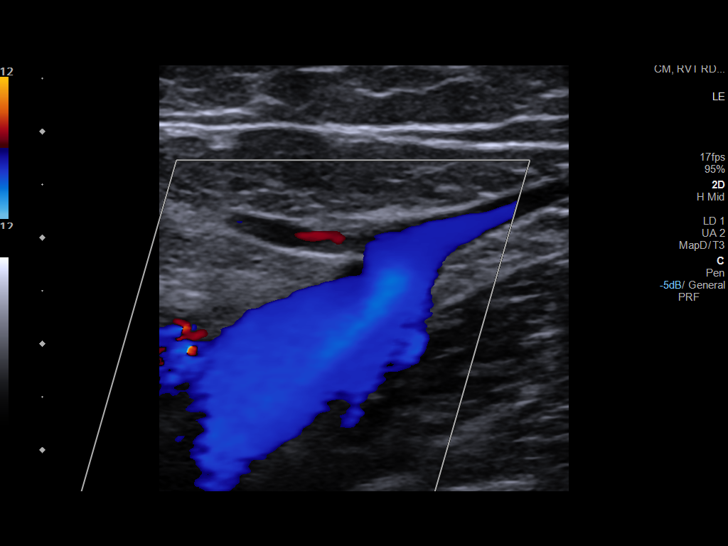
[im 6/35]
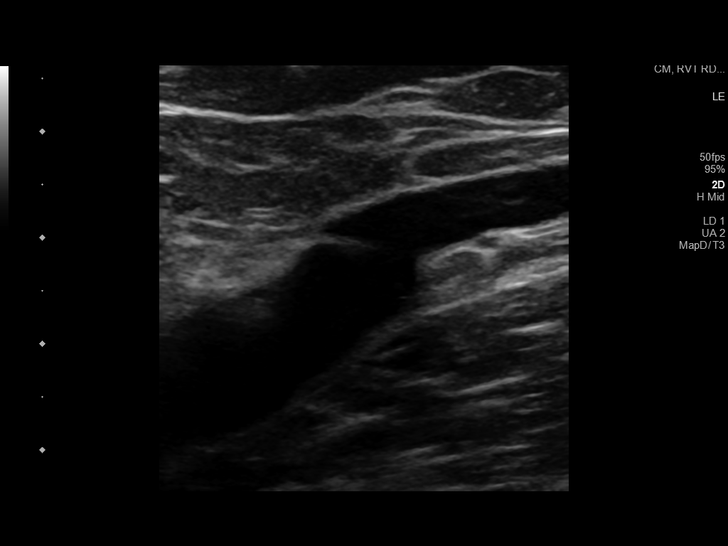
[im 9/35]
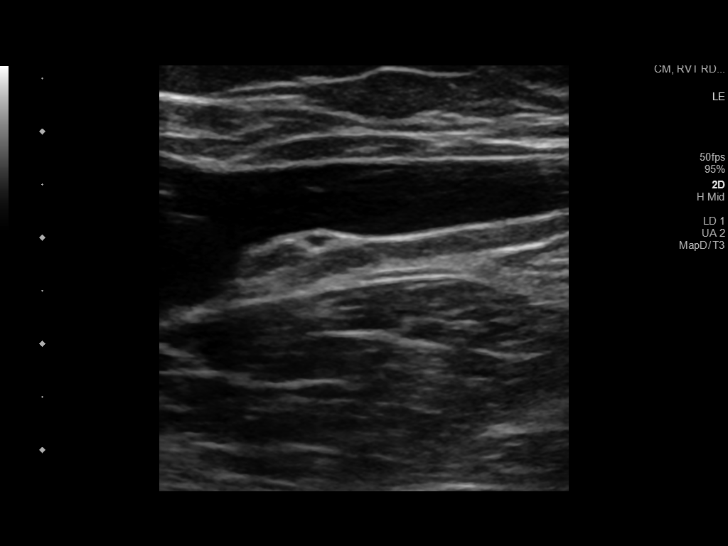
[im 12/35]
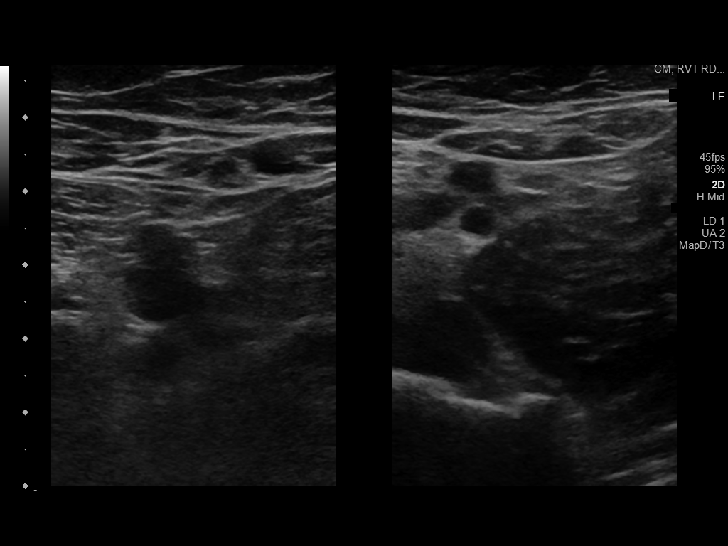
[im 15/35]
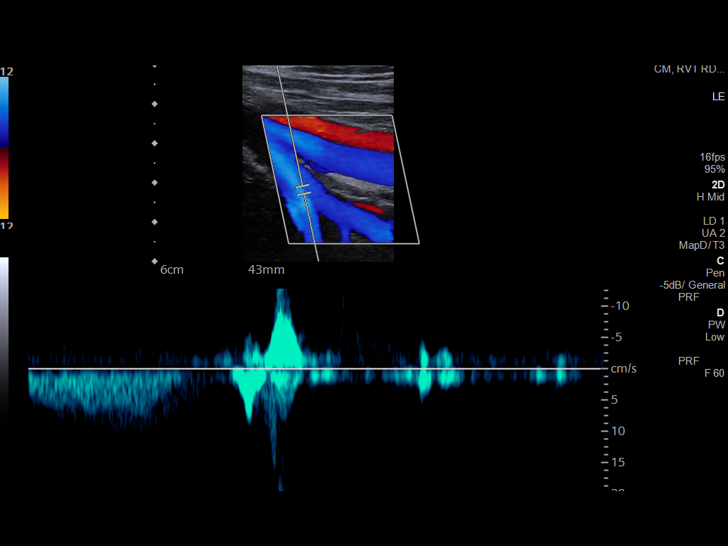
[im 18/35]
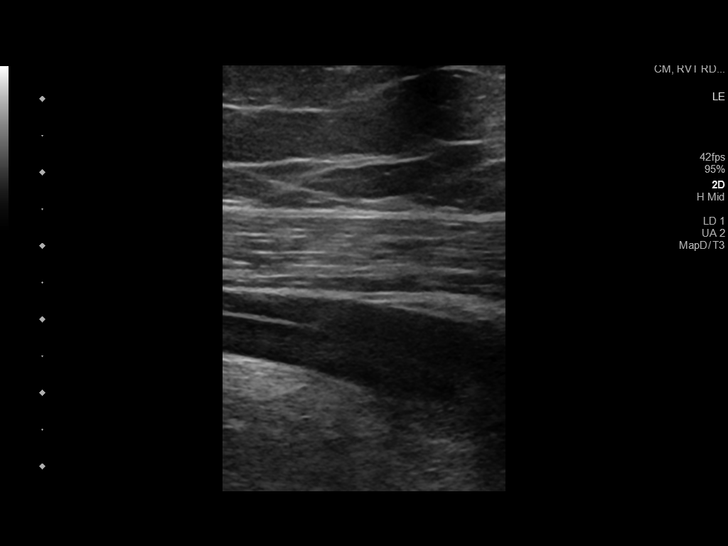
[im 20/35]
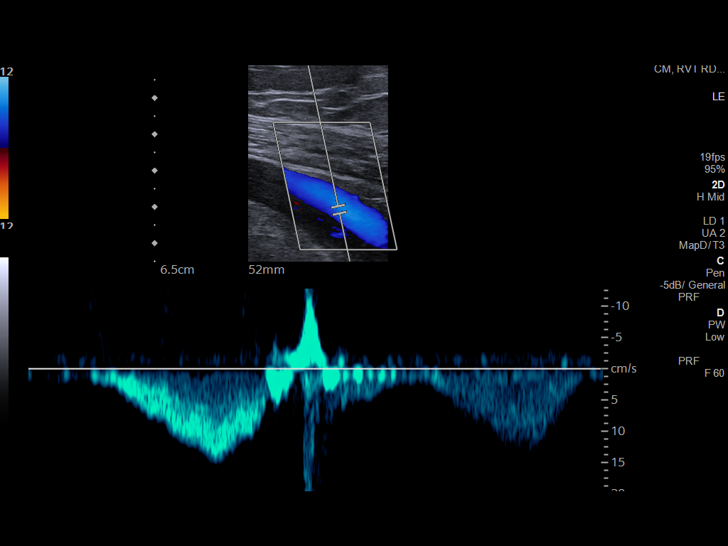
[im 23/35]
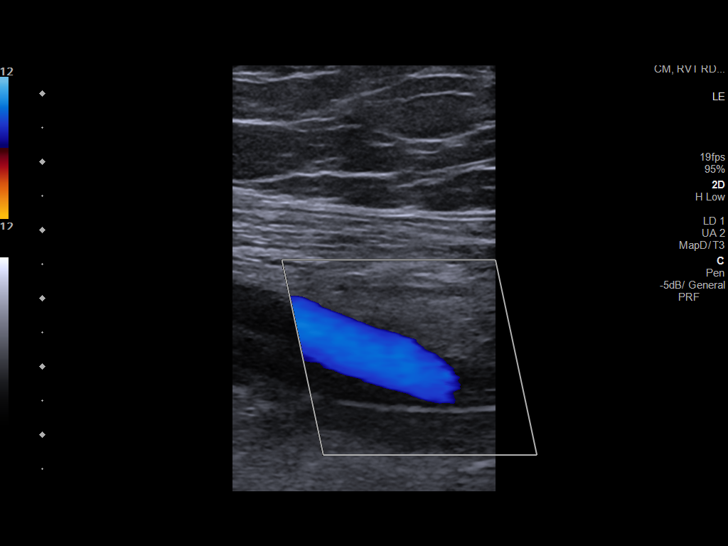
[im 26/35]
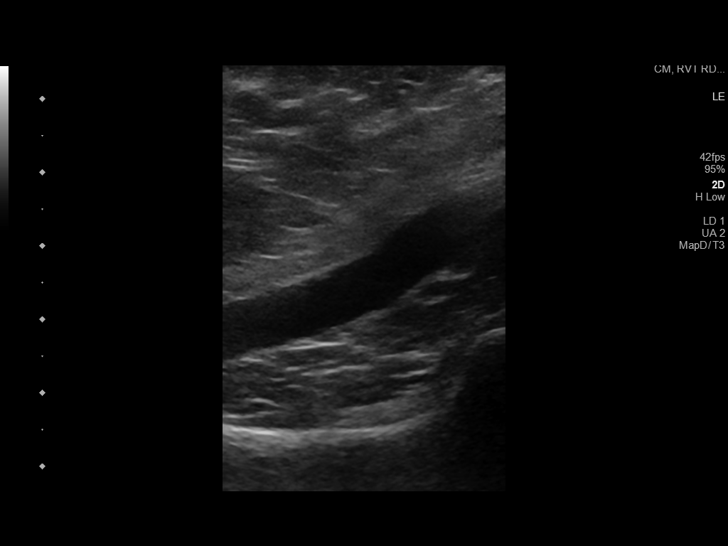
[im 29/35]
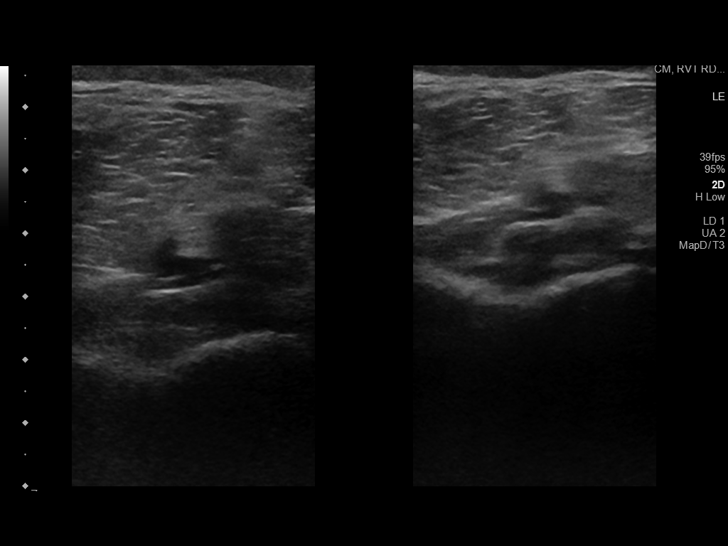
[im 32/35]
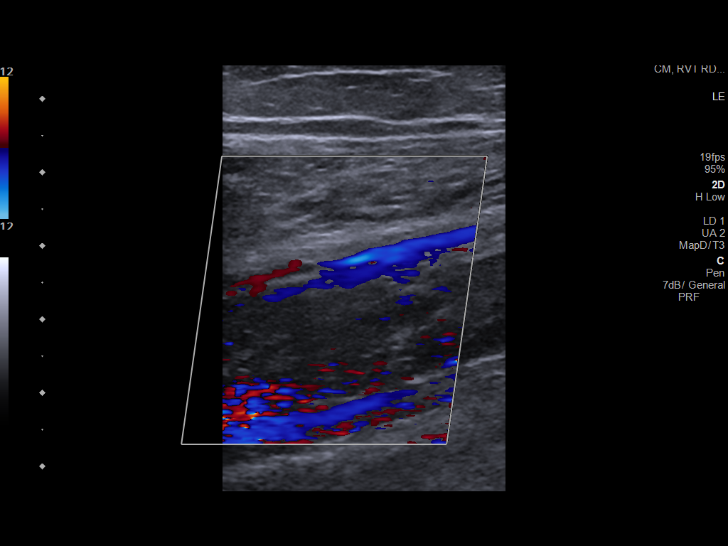
[im 35/35]
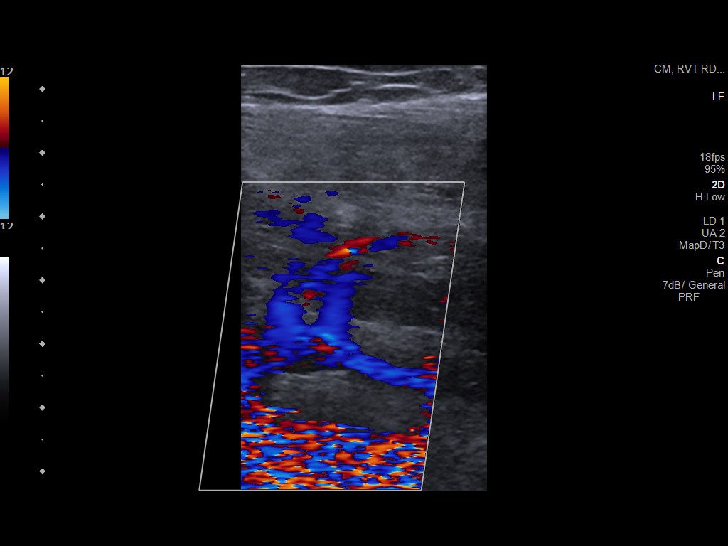

[13 of 24 positions shown; findings below may reference images not displayed]

FINDINGS: Contralateral Common Femoral Vein: Respiratory phasicity is normal
and symmetric with the symptomatic side. No evidence of thrombus.
Normal compressibility.

Common Femoral Vein: No evidence of thrombus. Normal
compressibility, respiratory phasicity and response to augmentation.

Saphenofemoral Junction: No evidence of thrombus. Normal
compressibility and flow on color Doppler imaging.

Profunda Femoral Vein: No evidence of thrombus. Normal
compressibility and flow on color Doppler imaging.

Femoral Vein: No evidence of thrombus. Normal compressibility,
respiratory phasicity and response to augmentation.

Popliteal Vein: No evidence of thrombus. Normal compressibility,
respiratory phasicity and response to augmentation.

Calf Veins: No evidence of thrombus. Normal compressibility and flow
on color Doppler imaging.

Superficial Great Saphenous Vein: No evidence of thrombus. Normal
compressibility.

Venous Reflux:  None.

Other Findings:  None.
IMPRESSION: No evidence of DVT within the right lower extremity.

## 2020-11-03 ENCOUNTER — Inpatient Hospital Stay (HOSPITAL_COMMUNITY)
Admission: AD | Admit: 2020-11-03 | Discharge: 2020-11-03 | Disposition: A | Discharge status: Home / Self Care | Payer: BC Managed Care – PPO | Attending: Obstetrics & Gynecology | Admitting: Obstetrics & Gynecology

## 2020-11-03 ENCOUNTER — Encounter (HOSPITAL_COMMUNITY): Payer: Self-pay | Admitting: Obstetrics & Gynecology

## 2020-11-03 ENCOUNTER — Other Ambulatory Visit: Payer: Self-pay

## 2020-11-03 DIAGNOSIS — Z3A28 28 weeks gestation of pregnancy: Secondary | ICD-10-CM

## 2020-11-03 DIAGNOSIS — R5383 Other fatigue: Secondary | ICD-10-CM

## 2020-11-03 DIAGNOSIS — O99891 Other specified diseases and conditions complicating pregnancy: Secondary | ICD-10-CM

## 2020-11-03 DIAGNOSIS — R531 Weakness: Secondary | ICD-10-CM | POA: Diagnosis not present

## 2020-11-03 DIAGNOSIS — D649 Anemia, unspecified: Secondary | ICD-10-CM

## 2020-11-03 DIAGNOSIS — R1032 Left lower quadrant pain: Secondary | ICD-10-CM | POA: Insufficient documentation

## 2020-11-03 DIAGNOSIS — O26893 Other specified pregnancy related conditions, third trimester: Secondary | ICD-10-CM | POA: Insufficient documentation

## 2020-11-03 DIAGNOSIS — O26813 Pregnancy related exhaustion and fatigue, third trimester: Secondary | ICD-10-CM | POA: Diagnosis not present

## 2020-11-03 DIAGNOSIS — N858 Other specified noninflammatory disorders of uterus: Secondary | ICD-10-CM | POA: Diagnosis not present

## 2020-11-03 DIAGNOSIS — O99013 Anemia complicating pregnancy, third trimester: Secondary | ICD-10-CM | POA: Diagnosis not present

## 2020-11-03 DIAGNOSIS — R1031 Right lower quadrant pain: Secondary | ICD-10-CM | POA: Diagnosis not present

## 2020-11-03 LAB — URINALYSIS, ROUTINE W REFLEX MICROSCOPIC
Bilirubin Urine: NEGATIVE
Glucose, UA: NEGATIVE mg/dL
Hgb urine dipstick: NEGATIVE
Ketones, ur: NEGATIVE mg/dL
Leukocytes,Ua: NEGATIVE
Nitrite: NEGATIVE
Protein, ur: NEGATIVE mg/dL
Specific Gravity, Urine: 1.013 (ref 1.005–1.030)
pH: 7 (ref 5.0–8.0)

## 2020-11-03 LAB — FETAL FIBRONECTIN: Fetal Fibronectin: NEGATIVE

## 2020-11-03 MED ORDER — NIFEDIPINE 10 MG PO CAPS
10.0000 mg | ORAL_CAPSULE | ORAL | Status: DC | PRN
  Administered 2020-11-03: 10 mg via ORAL
  Filled 2020-11-03: qty 1

## 2020-11-03 NOTE — MAU Note (Signed)
Pt reports she has felt weak and tired today. Stated she got her blood results in my chart and her H/H 10/32. 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 Good fetal movement felt. Denies and vag bleeding and reports  thin white vaginal discharge that started this morning.

## 2020-11-03 NOTE — MAU Provider Note (Signed)
Chief Complaint:  Fatigue   Event Date/Time   First Provider Initiated Contact with Patient 11/03/20 2030     HPI: Karen Suarez is a 25 y.o. 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  Reports she had a placenta previa earlier in pregnancy that later moved away.  Worried that is bleeding. . She reports good fetal movement, denies LOF, vaginal bleeding, vaginal itching/burning, urinary symptoms, h/a, dizziness, n/v, diarrhea, constipation or fever/chills.  .  Abdominal Pain This is a new problem. The current episode started today. The problem occurs intermittently. The pain is located in the suprapubic region, LLQ and RLQ. The quality of the pain is cramping. The abdominal pain does not radiate. Pertinent negatives include no constipation, diarrhea, dysuria, fever, frequency, headaches, myalgias, nausea or vomiting. Nothing aggravates the pain. The pain is relieved by Nothing.   RN Note: Pt reports she has felt weak and tired today. Stated she got her blood results in my chart and her H/H 10/32. 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 Good fetal movement felt. Denies and vag bleeding and reports  thin white vaginal discharge that started this morning.   Past Medical History: Past Medical History:  Diagnosis Date   Complication of anesthesia    hard to wake up   Family history of adverse reaction to anesthesia    pt's father has hx. of being hard to wake up post-op   Pregnancy induced hypertension    Tonsillar and adenoid hypertrophy 01/2015    Past obstetric history: OB History  Gravida Para Term Preterm AB Living  3 1 1  0 1 1  SAB IAB Ectopic Multiple Live Births  1 0 0 0 1    # Outcome Date GA Lbr Len/2nd Weight Sex Delivery Anes PTL Lv  3 Current            2 Term 07/29/19 [redacted]w[redacted]d  4050 g M CS-LTranv Spinal  LIV  1 SAB             Past Surgical History: Past Surgical History:  Procedure Laterality Date   CESAREAN SECTION N/A 07/29/2019   Procedure: Primary CESAREAN SECTION;  Surgeon: 09/28/2019, MD;  Location: [facility];  Service: Obstetrics;  Laterality: N/A;   NO PAST SURGERIES     TONSILLECTOMY AND ADENOIDECTOMY N/A 01/31/2015   Procedure: TONSILLECTOMY AND ADENOIDECTOMY;  Surgeon: 14/01/2015, MD;  Location:  [facility];  Service: ENT;  Laterality: N/A;    Family History: Family History  Problem Relation Age of Onset   Diabetes Maternal Grandfather    Diabetes Father    Anesthesia problems Father        hard to wake up post-op   Heart disease Father    Diabetes Paternal Grandfather     Social History: Social History   Tobacco Use   Smoking status: Never   Smokeless tobacco: Never  Vaping Use   Vaping Use: Never used  Substance Use Topics   Alcohol use: No   Drug use: No    Allergies: No Known Allergies  Meds:  Medications Prior to Admission  Medication Sig Dispense Refill Last Dose   Prenatal Vit-Fe Fumarate-FA (MULTIVITAMIN-PRENATAL) 27-0.8 MG TABS tablet Take 1 tablet by mouth daily at 12 noon.  11/03/2020   amoxicillin-clavulanate (AUGMENTIN) 875-125 MG tablet Take 1 tablet by mouth 2 (two) times daily. 14 tablet 0    fluticasone (FLONASE) 50 MCG/ACT nasal spray SPRAY 2 SPRAYS INTO EACH NOSTRIL EVERY DAY 48 mL 2 Unknown   I have reviewed patient's Past Medical Hx, Surgical Hx, Family Hx, Social Hx, medications and allergies.   ROS:  Review of Systems  Constitutional:  Negative for fever.  Gastrointestinal:  Positive for abdominal pain. Negative for constipation, diarrhea, nausea and vomiting.  Genitourinary:  Negative for dysuria and frequency.  Musculoskeletal:  Negative for myalgias.  Neurological:  Negative for headaches.  Other systems negative  Physical Exam  Patient Vitals for the past  24 hrs:  BP Temp Pulse Resp Height Weight  11/03/20 1958 126/82 97.9 F (36.6 C) (!) 103 18 5\' 1"  (1.549 m) 102.1 kg   Constitutional: Well-developed, well-nourished female in no acute distress.  Cardiovascular: normal rate and rhythm Respiratory: normal effort GI: Abd soft, non-tender, gravid appropriate for gestational age.   No rebound or guarding. MS: Extremities nontender, no edema, normal ROM Neurologic: Alert and oriented x 4.  GU: Neg CVAT.  PELVIC EXAM:   Dilation: Closed Effacement (%): 0 Cervical Position: Posterior Station: Ballotable Presentation: Undeterminable Exam by:: [email or web address] CNM  FHT:  Baseline 140 , moderate variability, accelerations present, no decelerations Contractions: Uterine irritability   Labs: Results for orders placed or performed during the hospital encounter of 11/03/20 (from the past 24 hour(s))  Urinalysis, Routine w reflex microscopic Urine, Clean Catch     Status: Abnormal   Collection Time: 11/03/20  7:33 PM  Result Value Ref Range   Color, Urine YELLOW YELLOW   APPearance HAZY (A) CLEAR   Specific Gravity, Urine 1.013 1.005 - 1.030   pH 7.0 5.0 - 8.0   Glucose, UA NEGATIVE NEGATIVE mg/dL   Hgb urine dipstick NEGATIVE NEGATIVE   Bilirubin Urine NEGATIVE NEGATIVE   Ketones, ur NEGATIVE NEGATIVE mg/dL   Protein, ur NEGATIVE NEGATIVE mg/dL   Nitrite NEGATIVE NEGATIVE   Leukocytes,Ua NEGATIVE NEGATIVE  Fetal fibronectin     Status: None   Collection Time: 11/03/20  8:34 PM  Result Value Ref Range   Fetal Fibronectin NEGATIVE NEGATIVE   Imaging:  No results found.  MAU Course/MDM: I have ordered labs and reviewed results. UA is clear. Fetal fibronectin is negative Discussed this is reassuring for predicting no preterm delivery in the next 2 wks NST reviewed, reactive Treatments in MAU included Procardia series (1 dose) which stopped irritability.   Reviewed nature of PTL vs uterine irritability Reviewed normal HGB in pregnancy and  how to take iron.  Assessment: [place] IUP at [redacted]w[redacted]d Uterine irritabiliy Anemia, mild Tired/weakness  Plan: Discharge home Preterm Labor precautions and fetal kick counts Follow up in Office for prenatal visits  Encouraged to return if she develops worsening of symptoms, increase in pain, fever, or other concerning symptoms.  Pt stable at time of discharge.  [redacted]w[redacted]d CNM, MSN Certified Nurse-Midwife 11/03/2020 8:30 PM

## 2020-12-01 ENCOUNTER — Other Ambulatory Visit: Payer: Self-pay | Admitting: Obstetrics and Gynecology

## 2020-12-26 ENCOUNTER — Encounter (HOSPITAL_COMMUNITY): Payer: Self-pay | Admitting: Obstetrics & Gynecology

## 2020-12-26 ENCOUNTER — Observation Stay (HOSPITAL_COMMUNITY)
Admission: AD | Admit: 2020-12-26 | Discharge: 2020-12-27 | Disposition: A | Discharge status: Home / Self Care | Payer: BC Managed Care – PPO | Attending: Obstetrics & Gynecology | Admitting: Obstetrics & Gynecology

## 2020-12-26 ENCOUNTER — Other Ambulatory Visit: Payer: Self-pay

## 2020-12-26 DIAGNOSIS — O133 Gestational [pregnancy-induced] hypertension without significant proteinuria, third trimester: Secondary | ICD-10-CM | POA: Diagnosis present

## 2020-12-26 DIAGNOSIS — Z98891 History of uterine scar from previous surgery: Secondary | ICD-10-CM

## 2020-12-26 DIAGNOSIS — Z3A35 35 weeks gestation of pregnancy: Secondary | ICD-10-CM

## 2020-12-26 DIAGNOSIS — Z20822 Contact with and (suspected) exposure to covid-19: Secondary | ICD-10-CM | POA: Insufficient documentation

## 2020-12-26 DIAGNOSIS — O139 Gestational [pregnancy-induced] hypertension without significant proteinuria, unspecified trimester: Secondary | ICD-10-CM | POA: Diagnosis present

## 2020-12-26 LAB — PROTEIN / CREATININE RATIO, URINE
Creatinine, Urine: 23.24 mg/dL
Total Protein, Urine: <6 mg/dL

## 2020-12-26 LAB — CBC
HCT: 33.6 % — ABNORMAL LOW (ref 36.0–46.0)
Hemoglobin: 11.1 g/dL — ABNORMAL LOW (ref 12.0–15.0)
MCH: 27.6 pg (ref 26.0–34.0)
MCHC: 33 g/dL (ref 30.0–36.0)
MCV: 83.6 fL (ref 80.0–100.0)
Platelets: 281 10*3/uL (ref 150–400)
RBC: 4.02 MIL/uL (ref 3.87–5.11)
RDW: 14.2 % (ref 11.5–15.5)
WBC: 9.5 10*3/uL (ref 4.0–10.5)
nRBC: 0 % (ref 0.0–0.2)

## 2020-12-26 LAB — COMPREHENSIVE METABOLIC PANEL
ALT: 23 U/L (ref 0–44)
AST: 28 U/L (ref 15–41)
Albumin: 2.6 g/dL — ABNORMAL LOW (ref 3.5–5.0)
Alkaline Phosphatase: 133 U/L — ABNORMAL HIGH (ref 38–126)
Anion gap: 13 (ref 5–15)
BUN: 6 mg/dL (ref 6–20)
CO2: 21 mmol/L — ABNORMAL LOW (ref 22–32)
Calcium: 8.9 mg/dL (ref 8.9–10.3)
Chloride: 102 mmol/L (ref 98–111)
Creatinine, Ser: 0.68 mg/dL (ref 0.44–1.00)
GFR, Estimated: >60 mL/min (ref >60)
Glucose, Bld: 105 mg/dL — ABNORMAL HIGH (ref 70–99)
Potassium: 3.7 mmol/L (ref 3.5–5.1)
Sodium: 136 mmol/L (ref 135–145)
Total Bilirubin: 0.6 mg/dL (ref 0.3–1.2)
Total Protein: 6 g/dL — ABNORMAL LOW (ref 6.5–8.1)

## 2020-12-26 LAB — RESP PANEL BY RT-PCR (FLU A&B, COVID) ARPGX2
Influenza A by PCR: NEGATIVE
Influenza B by PCR: NEGATIVE
SARS Coronavirus 2 by RT PCR: NEGATIVE

## 2020-12-26 MED ORDER — ZOLPIDEM TARTRATE 5 MG PO TABS: 5.0000 mg | ORAL_TABLET | Freq: Every evening | ORAL | Status: DC | PRN

## 2020-12-26 MED ORDER — CALCIUM CARBONATE ANTACID 500 MG PO CHEW: 2.0000 | CHEWABLE_TABLET | ORAL | Status: DC | PRN

## 2020-12-26 MED ORDER — PRENATAL MULTIVITAMIN CH
1.0000 | ORAL_TABLET | Freq: Every day | ORAL | Status: DC
  Administered 2020-12-27: 1 via ORAL
  Filled 2020-12-26 (×2): qty 1

## 2020-12-26 MED ORDER — DOCUSATE SODIUM 100 MG PO CAPS
100.0000 mg | ORAL_CAPSULE | Freq: Every day | ORAL | Status: DC
  Administered 2020-12-27: 100 mg via ORAL
  Filled 2020-12-26: qty 1

## 2020-12-26 MED ORDER — BETAMETHASONE SOD PHOS & ACET 6 (3-3) MG/ML IJ SUSP
12.0000 mg | INTRAMUSCULAR | Status: AC
  Administered 2020-12-26 – 2020-12-27 (×2): 12 mg via INTRAMUSCULAR
  Filled 2020-12-26: qty 5

## 2020-12-26 MED ORDER — ACETAMINOPHEN 325 MG PO TABS: 650.0000 mg | ORAL_TABLET | ORAL | Status: DC | PRN

## 2020-12-26 MED ORDER — ACETAMINOPHEN 325 MG PO TABS
650.0000 mg | ORAL_TABLET | Freq: Once | ORAL | Status: AC
  Administered 2020-12-26: 650 mg via ORAL
  Filled 2020-12-26: qty 2

## 2020-12-26 NOTE — MAU Note (Signed)
.  Karen Suarez is a 25 y.o. at [redacted]w[redacted]d here in MAU reporting: sent from office for elevated BP, floaters, and headache. She has not taken anything for the headache. Was induced for GHTN with her last baby. Denies Vb or LOF.   Pain score: 5 Vitals:   12/26/20 1642  BP: (!) 141/87  Pulse: (!) 108  Resp: 15  Temp: 97.9 F (36.6 C)  SpO2: 97%     FHT:156 Lab orders placed from triage:  UA

## 2020-12-26 NOTE — H&P (Signed)
OB ADMISSION/ HISTORY & PHYSICAL:  Admission Date: 12/26/2020  4:31 PM  Admit Diagnosis: Gestational hypertension  Karen Suarez is a 25 y.o. female G3P1011 [redacted]w[redacted]d presenting for elevated B/P in office today. Endorses active FM, denies LOF, ctx and vaginal bleeding.   History of current pregnancy: G3P1011   Primary OB Provider: [facility] Patient entered care with [facility] at 10.6 wks.   EDC 01/25/21 by 10.6wk U/S. Unsure LMP Anatomy scan:  20.5 wks, complete w/ anterior placenta.   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   Acute rhinosinusitis 03/29/2020   Postpartum care following cesarean delivery 6/9 07/31/2019   Large for dates affecting management of mother, third trimester, not applicable or unspecified fetus 07/30/2019   S/P cesarean section 07/29/2019    Prenatal Labs: ABO, Rh:  A Positive Antibody:  Negative Rubella:   Immune RPR:   NR HBsAg:   Negative HIV:   Negative GTT: 131 GBS:   Unknown GC/CHL: Negative/Negative Genetics: Low risk female, negative AFP Tdap/influenza vaccines: UTD   OB History  Gravida Para Term Preterm AB Living  3 1 1  0 1 1  SAB IAB Ectopic Multiple Live Births  1 0 0 0 1    # Outcome Date GA Lbr Len/2nd Weight Sex Delivery Anes PTL Lv  3 Current           2 Term 07/29/19 [redacted]w[redacted]d  4050 g M CS-LTranv Spinal  LIV  1 SAB             Medical / Surgical History: Past medical history:  Past Medical History:  Diagnosis Date   Complication of anesthesia    hard to wake up   Family history of adverse reaction to anesthesia    pt's father has hx. of being hard to wake up post-op   Pregnancy induced hypertension    Tonsillar and adenoid hypertrophy 01/2015    Past surgical history:  Past Surgical History:  Procedure Laterality Date   CESAREAN SECTION  N/A 07/29/2019   Procedure: Primary CESAREAN SECTION;  Surgeon: 09/28/2019, MD;  Location: [facility];  Service: Obstetrics;  Laterality: N/A;   NO PAST SURGERIES     TONSILLECTOMY AND ADENOIDECTOMY N/A 01/31/2015   Procedure: TONSILLECTOMY AND ADENOIDECTOMY;  Surgeon: 14/01/2015, MD;  Location: [facility];  Service: ENT;  Laterality: N/A;   Family History:  Family History  Problem Relation Age of Onset   Diabetes Maternal Grandfather    Diabetes Father    Anesthesia problems Father        hard to wake up post-op   Heart disease Father    Diabetes Paternal Grandfather     Social History:  reports that she has never smoked. She has never used smokeless tobacco. She reports that she does not drink alcohol and does not use drugs.  Allergies: Patient has no known allergies.   Current Medications at time of admission:  Prior to Admission medications   Medication Sig Start Date End Date Taking? Authorizing Provider  Prenatal Vit-Fe Fumarate-FA (MULTIVITAMIN-PRENATAL) 27-0.8 MG TABS tablet Take 1 tablet by mouth daily at 12 noon.   Yes [provider]  fluticasone (FLONASE) 50 MCG/ACT nasal spray SPRAY 2 SPRAYS INTO EACH NOSTRIL EVERY DAY 03/09/20   03/11/20, MD    Review of Systems:  Constitutional: Negative   HENT: Negative Eyes: Positive for floaters  Respiratory: Negative   Cardiovascular: Negative   Gastrointestinal: Negative  Genitourinary: negative for bloody show, negative for LOF   Musculoskeletal: Negative   Skin: Negative   Neurological: Positive for HA   Endo/Heme/Allergies: Negative   Psychiatric/Behavioral: Negative    Physical Exam: VS: Blood pressure (!) 156/87, pulse (!) 107, temperature 97.9 F (36.6 C), temperature source Oral, resp. rate 15, height 5\' 1"  (1.549 m), weight 107.9 kg, SpO2 97 %, unknown if currently breastfeeding. AAO x3, no signs of distress Cardiovascular: RRR Respiratory: Lung fields clear to ausculation GU/GI:  Abdomen gravid, non-tender, non-distended, active FM Extremities: negative edema, negative for pain, tenderness, and cords DTR 2+/2+, negative clonus  Cervical exam:  FHR: baseline rate 150 / variability moderate / accelerations present / absent decelerations TOCO: none   Prenatal Transfer Tool  Maternal Diabetes: No Genetic Screening: Normal Maternal Ultrasounds/Referrals: Normal Fetal Ultrasounds or other Referrals:  None Maternal Substance Abuse:  No Significant Maternal Medications:  None Significant Maternal Lab Results: Other:  unknown GBS    Assessment: 25 y.o. G3P1011 [redacted]w[redacted]d admitted for 23 hour b/p observation.  C/O mild HA improved with tylenol, occasional floaters, denies epigastric pain FHR category 1    Plan:  Admit to [facility] Routine antepartum orders Regular diet Continuous EFM BMZ now and repeat in 24 hours Tylenol for HA  Dr [redacted]w[redacted]d notified of admission and plan of care  Normand Sloop MSN, CNM 12/26/2020 8:29 PM

## 2020-12-26 NOTE — MAU Provider Note (Signed)
History     CSN: [id number]  Arrival date and time: 12/26/20 1631      Chief Complaint  Patient presents with   Hypertension   HPI This is a 25 yo G3P1011 at [redacted]w[redacted]d with a history of c/s around 37 weeks due to [facility]. She was sent to the MAU from the office due to elevated blood pressures. She admits to having intermittent scotoma and mild headache for the past few days. In the office, her BP was 142/92.  OB History     Gravida  3   Para  1   Term  1   Preterm  0   AB  1   Living  1      SAB  1   IAB  0   Ectopic  0   Multiple  0   Live Births  1           Past Medical History:  Diagnosis Date   Complication of anesthesia    hard to wake up   Family history of adverse reaction to anesthesia    pt's father has hx. of being hard to wake up post-op   Pregnancy induced hypertension    Tonsillar and adenoid hypertrophy 01/2015    Past Surgical History:  Procedure Laterality Date   CESAREAN SECTION N/A 07/29/2019   Procedure: Primary CESAREAN SECTION;  Surgeon: Jaymes Graff, MD;  Location: [facility];  Service: Obstetrics;  Laterality: N/A;   NO PAST SURGERIES     TONSILLECTOMY AND ADENOIDECTOMY N/A 01/31/2015   Procedure: TONSILLECTOMY AND ADENOIDECTOMY;  Surgeon: Newman Pies, MD;  Location: [facility];  Service: ENT;  Laterality: N/A;    Family History  Problem Relation Age of Onset   Diabetes Maternal Grandfather    Diabetes Father    Anesthesia problems Father        hard to wake up post-op   Heart disease Father    Diabetes Paternal Grandfather     Social History   Tobacco Use   Smoking status: Never   Smokeless tobacco: Never  Vaping Use   Vaping Use: Never used  Substance Use Topics   Alcohol use: No   Drug use: No    Allergies: No Known Allergies  Medications Prior to Admission  Medication Sig Dispense Refill Last Dose   Prenatal Vit-Fe Fumarate-FA (MULTIVITAMIN-PRENATAL) 27-0.8 MG TABS tablet Take 1 tablet by mouth  daily at 12 noon.   12/26/2020   fluticasone (FLONASE) 50 MCG/ACT nasal spray SPRAY 2 SPRAYS INTO EACH NOSTRIL EVERY DAY 48 mL 2 More than a month    Review of Systems Physical Exam   Blood pressure 133/87, pulse 100, temperature 97.9 F (36.6 C), temperature source Oral, resp. rate 15, height 5\' 1"  (1.549 m), weight 107.9 kg, SpO2 97 %, unknown if currently breastfeeding.  Patient Vitals for the past 24 hrs:  BP Temp Temp src Pulse Resp SpO2 Height Weight  12/26/20 1831 133/87 -- -- 100 -- -- -- --  12/26/20 1816 (!) 111/97 -- -- (!) 109 -- -- -- --  12/26/20 1801 (!) 152/96 -- -- (!) 112 -- -- -- --  12/26/20 1746 (!) 154/93 -- -- 93 -- -- -- --  12/26/20 1731 (!) 160/100 -- -- (!) 107 -- -- -- --  12/26/20 1716 (!) 153/94 -- -- 97 -- -- -- --  12/26/20 1700 (!) 149/100 -- -- 98 -- -- -- --  12/26/20 1642 (!) 141/87 97.9 F (36.6 C)  Oral (!) 108 15 97 % 5\' 1"  (1.549 m) 107.9 kg     Physical Exam Vitals reviewed.  Constitutional:      Appearance: Normal appearance.  HENT:     Head: Normocephalic and atraumatic.  Cardiovascular:     Rate and Rhythm: Normal rate and regular rhythm.     Pulses: Normal pulses.     Heart sounds: Normal heart sounds.  Pulmonary:     Effort: Pulmonary effort is normal.     Breath sounds: Normal breath sounds.  Abdominal:     General: Abdomen is flat. There is no distension.     Palpations: Abdomen is soft.     Tenderness: There is no abdominal tenderness.  Skin:    General: Skin is warm and dry.     Capillary Refill: Capillary refill takes less than 2 seconds.  Neurological:     General: No focal deficit present.     Mental Status: She is alert.  Psychiatric:        Mood and Affect: Mood normal.        Behavior: Behavior normal.        Thought Content: Thought content normal.        Judgment: Judgment normal.   Results for orders placed or performed during the hospital encounter of 12/26/20 (from the past 24 hour(s))  Comprehensive  metabolic panel     Status: Abnormal   Collection Time: 12/26/20  4:59 PM  Result Value Ref Range   Sodium 136 135 - 145 mmol/L   Potassium 3.7 3.5 - 5.1 mmol/L   Chloride 102 98 - 111 mmol/L   CO2 21 (L) 22 - 32 mmol/L   Glucose, Bld 105 (H) 70 - 99 mg/dL   BUN 6 6 - 20 mg/dL   Creatinine, Ser 13/07/22 0.44 - 1.00 mg/dL   Calcium 8.9 8.9 - 6.37 mg/dL   Total Protein 6.0 (L) 6.5 - 8.1 g/dL   Albumin 2.6 (L) 3.5 - 5.0 g/dL   AST 28 15 - 41 U/L   ALT 23 0 - 44 U/L   Alkaline Phosphatase 133 (H) 38 - 126 U/L   Total Bilirubin 0.6 0.3 - 1.2 mg/dL   GFR, Estimated 85.8 >85 mL/min   Anion gap 13 5 - 15  CBC     Status: Abnormal   Collection Time: 12/26/20  4:59 PM  Result Value Ref Range   WBC 9.5 4.0 - 10.5 K/uL   RBC 4.02 3.87 - 5.11 MIL/uL   Hemoglobin 11.1 (L) 12.0 - 15.0 g/dL   HCT 13/07/22 (L) 77.4 - 12.8 %   MCV 83.6 80.0 - 100.0 fL   MCH 27.6 26.0 - 34.0 pg   MCHC 33.0 30.0 - 36.0 g/dL   RDW 78.6 76.7 - 20.9 %   Platelets 281 150 - 400 K/uL   nRBC 0.0 0.0 - 0.2 %  Protein / creatinine ratio, urine     Status: None   Collection Time: 12/26/20  5:18 PM  Result Value Ref Range   Creatinine, Urine 23.24 mg/dL   Total Protein, Urine <6 mg/dL   Protein Creatinine Ratio        0.00 - 0.15 mg/mg[Cre]    MAU Course  Procedures NST:  Baseline: 150  Variability: moderate Accelerations: ++  Decelerations: none Contractions: none   MDM Check CBC, CMP, UP:C. Mild range BPs so far. BPs have remained in mild range - one severe. Labs reassuring. HA improved slightly with tylenol.  Assessment  and Plan   1. [redacted] weeks gestation of pregnancy   2. S/P cesarean section   3. Gestational hypertension, third trimester    Discussed with Dr Normand Sloop regarding labs, presentation, course. Patient to be admitted on ante for BMZ and serial BPs.  Levie Heritage 12/26/2020, 7:25 PM

## 2020-12-27 ENCOUNTER — Observation Stay (HOSPITAL_BASED_OUTPATIENT_CLINIC_OR_DEPARTMENT_OTHER): Payer: BC Managed Care – PPO

## 2020-12-27 DIAGNOSIS — O133 Gestational [pregnancy-induced] hypertension without significant proteinuria, third trimester: Secondary | ICD-10-CM | POA: Diagnosis not present

## 2020-12-27 DIAGNOSIS — E669 Obesity, unspecified: Secondary | ICD-10-CM | POA: Diagnosis not present

## 2020-12-27 DIAGNOSIS — O34219 Maternal care for unspecified type scar from previous cesarean delivery: Secondary | ICD-10-CM

## 2020-12-27 DIAGNOSIS — Z3A35 35 weeks gestation of pregnancy: Secondary | ICD-10-CM

## 2020-12-27 DIAGNOSIS — O99213 Obesity complicating pregnancy, third trimester: Secondary | ICD-10-CM

## 2020-12-27 NOTE — Discharge Summary (Signed)
Antepartum Discharge Summary  Date of Service updated 12/27/20     Patient Name: Karen Suarez DOB: 1996/02/05 MRN: [id number]  Date of admission: 12/26/2020 Delivery date:  Patient still pregnant Delivering provider: N/A Date of discharge: 12/27/2020  Admitting diagnosis: Gestational hypertension [O13.9] Intrauterine pregnancy: [redacted]w[redacted]d    Secondary diagnosis:  Active Problems:   Gestational hypertension  Additional problems: none    Discharge diagnosis: Gestational Hypertension                                               Complications: None  Hospital course: Patient admitted overnight for observation for elevated blood pressures. Betamethasone given for fetal lung maturity. Maternal Fetal Medicine was consulted to assist in management and limited OB ultrasound performed. Blood pressures improved and preeclampsia labs were negative. Patient discharged home after second steroid injection.   Magnesium Sulfate received: No BMZ received: Yes Rhophylac:No MMR:No T-DaP:Given prenatally Flu: No Transfusion:No  Physical exam  Vitals:   12/27/20 0339 12/27/20 0753 12/27/20 1227 12/27/20 1542  BP: 130/62 136/82 (!) 141/70 112/62  Pulse: 98 90 (!) 116 (!) 115  Resp: '17 18 18 18  ' Temp: 97.6 F (36.4 C) 97.8 F (36.6 C) 98 F (36.7 C) 98 F (36.7 C)  TempSrc: Oral Oral Oral Oral  SpO2: 98% 96% 98% 94%  Weight:      Height:       General: alert, cooperative, and no distress  Abd  Soft, gravid, nontender Ex SCDs FHTs  140s, moderate variability accels no decels Toco  no ctx  Cervix: not evaluated  Labs: Lab Results  Component Value Date   WBC 9.5 12/26/2020   HGB 11.1 (L) 12/26/2020   HCT 33.6 (L) 12/26/2020   MCV 83.6 12/26/2020   PLT 281 12/26/2020   CMP Latest Ref Rng & Units 12/26/2020  Glucose 70 - 99 mg/dL 105(H)  BUN 6 - 20 mg/dL 6  Creatinine 0.44 - 1.00 mg/dL 0.68  Sodium 135 - 145 mmol/L 136  Potassium 3.5 - 5.1 mmol/L 3.7  Chloride 98 - 111  mmol/L 102  CO2 22 - 32 mmol/L 21(L)  Calcium 8.9 - 10.3 mg/dL 8.9  Total Protein 6.5 - 8.1 g/dL 6.0(L)  Total Bilirubin 0.3 - 1.2 mg/dL 0.6  Alkaline Phos 38 - 126 U/L 133(H)  AST 15 - 41 U/L 28  ALT 0 - 44 U/L 23   Edinburgh Score: Edinburgh Postnatal Depression Scale Screening Tool 07/31/2019  I have been able to laugh and see the funny side of things. 0  I have looked forward with enjoyment to things. 0  I have blamed myself unnecessarily when things went wrong. 1  I have been anxious or worried for no good reason. 1  I have felt scared or panicky for no good reason. 0  Things have been getting on top of me. 1  I have been so unhappy that I have had difficulty sleeping. 0  I have felt sad or miserable. 0  I have been so unhappy that I have been crying. 1  The thought of harming myself has occurred to me. 0  Edinburgh Postnatal Depression Scale Total 4      After visit meds:  Allergies as of 12/27/2020   No Known Allergies      Medication List     TAKE these medications  fluticasone 50 MCG/ACT nasal spray Commonly known as: FLONASE SPRAY 2 SPRAYS INTO EACH NOSTRIL EVERY DAY   multivitamin-prenatal 27-0.8 MG Tabs tablet Take 1 tablet by mouth daily at 12 noon.         Discharge home in stable condition  Discharge instruction: see Discharge orders - watch for signs of preeclampsia  Activity: As tolerated Diet: routine diet  Antenatal Appointment: this week on Thursday,. Then twice weekly NSTs        12/27/2020 Sanjuana Kava, MD

## 2020-12-27 NOTE — Plan of Care (Signed)
  Problem: Education: Goal: Knowledge of the prescribed therapeutic regimen will improve Outcome: Adequate for Discharge   

## 2020-12-27 NOTE — Progress Notes (Signed)
Name: Karen Suarez Medical Record Number:  [id number] Date of Birth: [birth year] Date of Service: 12/27/2020  25 y.o. G3P1011 [redacted]w[redacted]d HD#0 admitted for Gestational hypertension [O13.9].  Pt currently stable with no complaints. Headache has resolved, no blurry vision, no spots in her vision, no RUQ pain.  She denies contractions, no vaginal bleeding, no leaking of fluid. Reports good FM.  The patient's past medical history and prenatal records were reviewed.  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   Acute rhinosinusitis 03/29/2020   Postpartum care following cesarean delivery 6/9 07/31/2019   Large for dates affecting management of mother, third trimester, not applicable or unspecified fetus 07/30/2019   S/P cesarean section 07/29/2019    Physical Examination:   Vitals:   12/27/20 1227 12/27/20 1542  BP: (!) 141/70 112/62  Pulse: (!) 116 (!) 115  Resp: 18 18  Temp: 98 F (36.7 C) 98 F (36.7 C)  SpO2: 98% 94%   General appearance - alert, well appearing, and in no distress  Abd  Soft, gravid, nontender Ex SCDs FHTs  140s, moderate variability accels no decels Toco  no ctx  Cervix: not evaluated  Results for orders placed or performed during the hospital encounter of 12/26/20 (from the past 24 hour(s))  Comprehensive metabolic panel     Status: Abnormal   Collection Time: 12/26/20  4:59 PM  Result Value Ref Range   Sodium 136 135 - 145 mmol/L   Potassium 3.7 3.5 - 5.1 mmol/L   Chloride 102 98 - 111 mmol/L   CO2 21 (L) 22 - 32 mmol/L   Glucose, Bld 105 (H) 70 - 99 mg/dL   BUN 6 6 - 20 mg/dL   Creatinine, Ser 4.68 0.44 - 1.00 mg/dL   Calcium 8.9 8.9 - 03.2 mg/dL   Total Protein 6.0 (L) 6.5 - 8.1 g/dL   Albumin 2.6 (L) 3.5 - 5.0 g/dL   AST 28 15 - 41 U/L   ALT 23 0 - 44 U/L   Alkaline Phosphatase 133 (H) 38 - 126 U/L   Total  Bilirubin 0.6 0.3 - 1.2 mg/dL   GFR, Estimated >12 >24 mL/min   Anion gap 13 5 - 15  CBC     Status: Abnormal   Collection Time: 12/26/20  4:59 PM  Result Value Ref Range   WBC 9.5 4.0 - 10.5 K/uL   RBC 4.02 3.87 - 5.11 MIL/uL   Hemoglobin 11.1 (L) 12.0 - 15.0 g/dL   HCT 82.5 (L) 00.3 - 70.4 %   MCV 83.6 80.0 - 100.0 fL   MCH 27.6 26.0 - 34.0 pg   MCHC 33.0 30.0 - 36.0 g/dL   RDW 88.8 91.6 - 94.5 %   Platelets 281 150 - 400 K/uL   nRBC 0.0 0.0 - 0.2 %  Protein / creatinine ratio, urine     Status: None   Collection Time: 12/26/20  5:18 PM  Result Value Ref Range   Creatinine, Urine 23.24 mg/dL   Total Protein, Urine <6 mg/dL   Protein Creatinine Ratio        0.00 - 0.15 mg/mg[Cre]  Resp Panel by RT-PCR (Flu A&B, Covid) Nasopharyngeal Swab     Status: None   Collection Time: 12/26/20  7:45 PM   Specimen: Nasopharyngeal Swab; Nasopharyngeal(NP) swabs in vial transport medium  Result Value Ref Range   SARS Coronavirus 2 by RT PCR NEGATIVE  NEGATIVE   Influenza A by PCR NEGATIVE NEGATIVE   Influenza B by PCR NEGATIVE NEGATIVE  Type and screen [facility]     Status: None   Collection Time: 12/27/20  6:49 AM  Result Value Ref Range   ABO/RH(D) A POS    Antibody Screen NEG    Sample Expiration      12/30/2020,2359 Performed at [facility], [address] [place], [place] [address]     Assessment: HD#0  [redacted]w[redacted]d with gestational hypertension.  Plan: Per MFM. Dr. Grace Bushy, patient can be discharged home after her second betamethasone dose Patient has antenatal testing in office scheduled for this Thursday, will continue biweekly NST's Patient counseled to watch for any signs or symptoms of preeclampsia. Will schedule repeat cesarean delivery for 37 weeks.   Naoma Diener Insiya Oshea

## 2020-12-27 NOTE — Consult Note (Signed)
MFM Consultation  DOS: 12/27/20  Requesting provider: Johney Suarez, CNM Reason for request: Gestational hypertension vs preeclampsia  Karen Suarez is a 25 yo G3P1 who is 62 w 6 d dated by ultrasound on 10/08/20.  She is currently hospitalized and seen today for new elevated hypertension at the request of Karen Suarez, CNM  Karen Suarez recalls an uncomplicated pregnancy thus far, with exception of possible placenta previa that is now resolved.  She had a recent growth performed at her providers office that was normal.  Regarding the current admission, she was sent to the hospital as she had elevated blood pressures in clinic. Her blood pressure was 142/92 mmHg at that time. She also reported a headache and vision that eventually resolved after treatment.  Her subsequent blood pressures ranged from 140-160/80's to 100 mmHg.   She did not require IV therapy to control her blood pressures. She has since normalized down to 130's to 140's. 70-80's.  She did received her first shot of betamethasone.    Vitals with BMI 12/27/2020 12/27/2020 12/26/2020  Height - - -  Weight - - -  BMI - - -  Systolic 136 130 329  Diastolic 82 62 62  Pulse 90 98 104   CBC Latest Ref Rng & Units 12/26/2020 07/30/2019 07/29/2019  WBC 4.0 - 10.5 K/uL 9.5 9.2 8.4  Hemoglobin 12.0 - 15.0 g/dL 11.1(L) 10.6(L) 11.4(L)  Hematocrit 36.0 - 46.0 % 33.6(L) 33.1(L) 35.3(L)  Platelets 150 - 400 K/uL 281 236 254   CMP Latest Ref Rng & Units 12/26/2020 07/29/2019 01/23/2019  Glucose 70 - 99 mg/dL 924(Q) 77 86  BUN 6 - 20 mg/dL 6 <6(S) 5(L)  Creatinine 0.44 - 1.00 mg/dL 3.41 9.62 2.29  Sodium 135 - 145 mmol/L 136 138 135  Potassium 3.5 - 5.1 mmol/L 3.7 3.6 3.5  Chloride 98 - 111 mmol/L 102 106 103  CO2 22 - 32 mmol/L 21(L) 21(L) 23  Calcium 8.9 - 10.3 mg/dL 8.9 7.9(G) 9.4  Total Protein 6.5 - 8.1 g/dL 6.0(L) 5.5(L) 6.6  Total Bilirubin 0.3 - 1.2 mg/dL 0.6 1.1 0.5  Alkaline Phos 38 - 126 U/L 133(H) 171(H) 78   AST 15 - 41 U/L 28 25 16   ALT 0 - 44 U/L 23 23 21    OB History  Gravida Para Term Preterm AB Living  3 1 1  0 1 1  SAB IAB Ectopic Multiple Live Births  1 0 0 0 1    # Outcome Date GA Lbr Len/2nd Weight Sex Delivery Anes PTL Lv  3 Current           2 Term 07/29/19 [redacted]w[redacted]d  4050 g M CS-LTranv Spinal  LIV     Name: Eke,BOY Eboney     Apgar1: 9  Apgar5: 9  1 SAB             Current Facility-Administered Medications (Endocrine & Metabolic):    betamethasone acetate-betamethasone sodium phosphate (CELESTONE) injection 12 mg       Current Facility-Administered Medications (Analgesics):    acetaminophen (TYLENOL) tablet 650 mg     Current Facility-Administered Medications (Other):    calcium carbonate (TUMS - dosed in mg elemental calcium) chewable tablet 400 mg of elemental calcium   docusate sodium (COLACE) capsule 100 mg   prenatal multivitamin tablet 1 tablet   zolpidem (AMBIEN) tablet 5 mg  No current outpatient medications on file. Family History  Problem Relation Age of Onset   Diabetes Maternal Grandfather    Diabetes  Father    Anesthesia problems Father        hard to wake up post-op   Heart disease Father    Diabetes Paternal Grandfather    Social History   Socioeconomic History   Marital status: Married    Spouse name: Not on file   Number of children: Not on file   Years of education: Not on file   Highest education level: Not on file  Occupational History   Not on file  Tobacco Use   Smoking status: Never   Smokeless tobacco: Never  Vaping Use   Vaping Use: Never used  Substance and Sexual Activity   Alcohol use: No   Drug use: No   Sexual activity: Yes    Birth control/protection: None  Other Topics Concern   Not on file  Social History Narrative   Not on file   Social Determinants of Health   Financial Resource Strain: Not on file  Food Insecurity: Not on file  Transportation Needs: Not on file  Physical Activity: Not on file   Stress: Not on file  Social Connections: Not on file  Intimate Partner Violence: Not on file   No Known Allergies   Imaging: 12/27/20 Single intrauterine pregnancy here for a complete exam as she is admitted for evalution of gestational hypertension. Normal anatomy with measurements consistent with large for dates EFW 99th% 3943g  There is good fetal movement and amniotic fluid volume Suboptimal views of the fetal anatomy were obtained secondary to fetal position and advanced gestational age.    Impression/Counseling:  I reviewed with Karen Suarez her history. I discussed that she has the diagnosis of gestational hypertension, given that she has normal labs, and no CNS signs.  Given this we recommend continued observation for the day if her blood pressures and symptoms remain mild, we would plan for discharge with a goal for delivery at 37 weeks.  I discussed with Karen Suarez that the recommendation for delivery at term is  due to the increased risk for disease progression of preeclampsia, stillbirth placental abruption and possible stroke.   Lastly, if her headache returns we recommend admission and delivery.  I spent 45 minutes with > 50% in face to face consultation, record review and care coordination.  All questions answered  Vikki Ports, MD

## 2020-12-28 ENCOUNTER — Encounter (HOSPITAL_COMMUNITY): Payer: Self-pay | Admitting: Obstetrics and Gynecology

## 2020-12-28 ENCOUNTER — Inpatient Hospital Stay (HOSPITAL_COMMUNITY)
Admission: AD | Admit: 2020-12-28 | Discharge: 2020-12-28 | Disposition: A | Discharge status: Home / Self Care | Payer: BC Managed Care – PPO | Source: Ambulatory Visit | Attending: Obstetrics and Gynecology | Admitting: Obstetrics and Gynecology

## 2020-12-28 ENCOUNTER — Other Ambulatory Visit: Payer: Self-pay

## 2020-12-28 DIAGNOSIS — Z3A36 36 weeks gestation of pregnancy: Secondary | ICD-10-CM | POA: Diagnosis not present

## 2020-12-28 DIAGNOSIS — O133 Gestational [pregnancy-induced] hypertension without significant proteinuria, third trimester: Secondary | ICD-10-CM

## 2020-12-28 LAB — TYPE AND SCREEN
ABO/RH(D): A POS
Antibody Screen: NEGATIVE

## 2020-12-28 LAB — URINALYSIS, ROUTINE W REFLEX MICROSCOPIC
Bilirubin Urine: NEGATIVE
Glucose, UA: NEGATIVE mg/dL
Hgb urine dipstick: NEGATIVE
Ketones, ur: 5 mg/dL — AB
Leukocytes,Ua: NEGATIVE
Nitrite: NEGATIVE
Protein, ur: NEGATIVE mg/dL
Specific Gravity, Urine: 1.011 (ref 1.005–1.030)
pH: 7 (ref 5.0–8.0)

## 2020-12-28 LAB — CBC
HCT: 33.5 % — ABNORMAL LOW (ref 36.0–46.0)
Hemoglobin: 11 g/dL — ABNORMAL LOW (ref 12.0–15.0)
MCH: 27.8 pg (ref 26.0–34.0)
MCHC: 32.8 g/dL (ref 30.0–36.0)
MCV: 84.6 fL (ref 80.0–100.0)
Platelets: 281 10*3/uL (ref 150–400)
RBC: 3.96 MIL/uL (ref 3.87–5.11)
RDW: 14.6 % (ref 11.5–15.5)
WBC: 14.8 10*3/uL — ABNORMAL HIGH (ref 4.0–10.5)
nRBC: 0 % (ref 0.0–0.2)

## 2020-12-28 LAB — COMPREHENSIVE METABOLIC PANEL
ALT: 21 U/L (ref 0–44)
AST: 19 U/L (ref 15–41)
Albumin: 2.8 g/dL — ABNORMAL LOW (ref 3.5–5.0)
Alkaline Phosphatase: 130 U/L — ABNORMAL HIGH (ref 38–126)
Anion gap: 12 (ref 5–15)
BUN: 7 mg/dL (ref 6–20)
CO2: 21 mmol/L — ABNORMAL LOW (ref 22–32)
Calcium: 8.6 mg/dL — ABNORMAL LOW (ref 8.9–10.3)
Chloride: 103 mmol/L (ref 98–111)
Creatinine, Ser: 0.63 mg/dL (ref 0.44–1.00)
GFR, Estimated: >60 mL/min (ref >60)
Glucose, Bld: 84 mg/dL (ref 70–99)
Potassium: 4 mmol/L (ref 3.5–5.1)
Sodium: 136 mmol/L (ref 135–145)
Total Bilirubin: 0.6 mg/dL (ref 0.3–1.2)
Total Protein: 6.2 g/dL — ABNORMAL LOW (ref 6.5–8.1)

## 2020-12-28 LAB — PROTEIN / CREATININE RATIO, URINE
Creatinine, Urine: 74.49 mg/dL
Protein Creatinine Ratio: 0.28 mg/mg{Cre} — ABNORMAL HIGH (ref 0.00–0.15)
Total Protein, Urine: 21 mg/dL

## 2020-12-28 MED ORDER — ACETAMINOPHEN 500 MG PO TABS
1000.0000 mg | ORAL_TABLET | Freq: Once | ORAL | Status: AC
  Administered 2020-12-28: 1000 mg via ORAL
  Filled 2020-12-28: qty 2

## 2020-12-28 NOTE — MAU Provider Note (Signed)
History     CSN: [id number]  Arrival date and time: 12/28/20 1126   Event Date/Time   First Provider Initiated Contact with Patient 12/28/20 1154      Chief Complaint  Patient presents with   Hypertension   Headache   Karen Suarez is a 25 y.o. G3P1011 at [redacted]w[redacted]d with known GHTN. She presents today with elevated blood pressure at home and a headache today. Reports BP at home was 158/112 and 140/100. She reports some decreased fetal movement, but has had normal movement since being placed on the monitor. She denies any contractions, VB or  LOF.   Hypertension This is a new problem. The current episode started today. The problem is unchanged. Associated symptoms include blurred vision and headaches. Associated agents: pregnancy.  Headache  This is a new problem. The current episode started today (about 2 hours prior to arrival.). The problem occurs constantly. The problem has been unchanged. The pain is located in the Frontal region. The pain does not radiate. The pain is at a severity of 6/10. Associated symptoms include blurred vision and nausea. Nothing aggravates the symptoms. She has tried nothing for the symptoms. Her past medical history is significant for hypertension.   OB History     Gravida  3   Para  1   Term  1   Preterm  0   AB  1   Living  1      SAB  1   IAB  0   Ectopic  0   Multiple  0   Live Births  1           Past Medical History:  Diagnosis Date   Complication of anesthesia    hard to wake up   Family history of adverse reaction to anesthesia    pt's father has hx. of being hard to wake up post-op   Pregnancy induced hypertension    Tonsillar and adenoid hypertrophy 01/2015    Past Surgical History:  Procedure Laterality Date   CESAREAN SECTION N/A 07/29/2019   Procedure: Primary CESAREAN SECTION;  Surgeon: Jaymes Graff, MD;  Location: [facility];  Service: Obstetrics;  Laterality: N/A;   NO PAST SURGERIES     TONSILLECTOMY AND  ADENOIDECTOMY N/A 01/31/2015   Procedure: TONSILLECTOMY AND ADENOIDECTOMY;  Surgeon: Newman Pies, MD;  Location: [facility];  Service: ENT;  Laterality: N/A;    Family History  Problem Relation Age of Onset   Diabetes Maternal Grandfather    Diabetes Father    Anesthesia problems Father        hard to wake up post-op   Heart disease Father    Diabetes Paternal Grandfather     Social History   Tobacco Use   Smoking status: Never   Smokeless tobacco: Never  Vaping Use   Vaping Use: Never used  Substance Use Topics   Alcohol use: No   Drug use: No    Allergies: No Known Allergies  Medications Prior to Admission  Medication Sig Dispense Refill Last Dose   fluticasone (FLONASE) 50 MCG/ACT nasal spray SPRAY 2 SPRAYS INTO EACH NOSTRIL EVERY DAY 48 mL 2    Prenatal Vit-Fe Fumarate-FA (MULTIVITAMIN-PRENATAL) 27-0.8 MG TABS tablet Take 1 tablet by mouth daily at 12 noon.       Review of Systems  Eyes:  Positive for blurred vision.  Gastrointestinal:  Positive for nausea.  Neurological:  Positive for headaches.  Physical Exam   Blood pressure 128/73, pulse Marland Kitchen)  117, temperature 97.9 F (36.6 C), temperature source Oral, resp. rate 16, height 5\' 1"  (1.549 m), weight 104.6 kg, SpO2 97 %, unknown if currently breastfeeding.  Physical Exam Vitals and nursing note reviewed.  Constitutional:      General: She is not in acute distress. Eyes:     Pupils: Pupils are equal, round, and reactive to light.  Cardiovascular:     Rate and Rhythm: Normal rate.  Pulmonary:     Effort: Pulmonary effort is normal.  Abdominal:     Palpations: Abdomen is soft.     Tenderness: There is no abdominal tenderness.  Musculoskeletal:     Right lower leg: No edema.     Left lower leg: No edema.  Skin:    General: Skin is warm and dry.  Neurological:     Mental Status: She is alert and oriented to person, place, and time.     Deep Tendon Reflexes: Reflexes normal.     Comments: No  clonus   Psychiatric:        Behavior: Behavior normal.    NST:  Baseline: 150 Variability: moderate Accels: 15x15 Decels: none Toco: rare Reactive/Appropriate for GA  Results for orders placed or performed during the hospital encounter of 12/28/20 (from the past 24 hour(s))  Urinalysis, Routine w reflex microscopic Urine, Clean Catch     Status: Abnormal   Collection Time: 12/28/20 11:44 AM  Result Value Ref Range   Color, Urine YELLOW YELLOW   APPearance HAZY (A) CLEAR   Specific Gravity, Urine 1.011 1.005 - 1.030   pH 7.0 5.0 - 8.0   Glucose, UA NEGATIVE NEGATIVE mg/dL   Hgb urine dipstick NEGATIVE NEGATIVE   Bilirubin Urine NEGATIVE NEGATIVE   Ketones, ur 5 (A) NEGATIVE mg/dL   Protein, ur NEGATIVE NEGATIVE mg/dL   Nitrite NEGATIVE NEGATIVE   Leukocytes,Ua NEGATIVE NEGATIVE  Protein / creatinine ratio, urine     Status: Abnormal   Collection Time: 12/28/20 11:54 AM  Result Value Ref Range   Creatinine, Urine 74.49 mg/dL   Total Protein, Urine 21 mg/dL   Protein Creatinine Ratio 0.28 (H) 0.00 - 0.15 mg/mg[Cre]  CBC     Status: Abnormal   Collection Time: 12/28/20 12:07 PM  Result Value Ref Range   WBC 14.8 (H) 4.0 - 10.5 K/uL   RBC 3.96 3.87 - 5.11 MIL/uL   Hemoglobin 11.0 (L) 12.0 - 15.0 g/dL   HCT 13/09/22 (L) 83.2 - 91.9 %   MCV 84.6 80.0 - 100.0 fL   MCH 27.8 26.0 - 34.0 pg   MCHC 32.8 30.0 - 36.0 g/dL   RDW 16.6 06.0 - 04.5 %   Platelets 281 150 - 400 K/uL   nRBC 0.0 0.0 - 0.2 %  Comprehensive metabolic panel     Status: Abnormal   Collection Time: 12/28/20 12:07 PM  Result Value Ref Range   Sodium 136 135 - 145 mmol/L   Potassium 4.0 3.5 - 5.1 mmol/L   Chloride 103 98 - 111 mmol/L   CO2 21 (L) 22 - 32 mmol/L   Glucose, Bld 84 70 - 99 mg/dL   BUN 7 6 - 20 mg/dL   Creatinine, Ser 13/09/22 0.44 - 1.00 mg/dL   Calcium 8.6 (L) 8.9 - 10.3 mg/dL   Total Protein 6.2 (L) 6.5 - 8.1 g/dL   Albumin 2.8 (L) 3.5 - 5.0 g/dL   AST 19 15 - 41 U/L   ALT 21 0 - 44 U/L    Alkaline  Phosphatase 130 (H) 38 - 126 U/L   Total Bilirubin 0.6 0.3 - 1.2 mg/dL   GFR, Estimated >07 >22 mL/min   Anion gap 12 5 - 15   Patient Vitals for the past 24 hrs:  BP Temp Temp src Pulse Resp SpO2 Height Weight  12/28/20 1331 119/60 -- -- (!) 109 -- -- -- --  12/28/20 1330 -- -- -- -- -- 99 % -- --  12/28/20 1316 129/79 -- -- 96 -- -- -- --  12/28/20 1301 122/78 -- -- (!) 104 -- -- -- --  12/28/20 1246 130/70 -- -- 97 -- -- -- --  12/28/20 1231 122/84 -- -- (!) 106 -- -- -- --  12/28/20 1230 -- -- -- -- -- 97 % -- --  12/28/20 1216 116/72 -- -- (!) 108 -- -- -- --  12/28/20 1201 (!) 109/55 -- -- (!) 108 -- -- -- --  12/28/20 1147 128/73 97.9 F (36.6 C) Oral (!) 117 16 97 % 5\' 1"  (1.549 m) 104.6 kg    MAU Course  Procedures  MDM  Patient reports that her headache is now a 5/10  (was 6/10)  1404: DW Dr. . She will have [facility] midwife come see the patient. Patient last ate at 0830am today and had a sip of water to take tylenol at 1218pm today.    Assessment and Plan  Gestational Hypertension  [facility] CNM will come see the patient to make a dispo decision   Normand Sloop DNP, CNM  12/28/20  2:15 PM

## 2020-12-28 NOTE — MAU Note (Signed)
Karen Suarez is a 24 y.o. at [redacted]w[redacted]d here in MAU reporting: hypertension, headache, and blurry vision. States BP on right arm is 158/112 and left arm was 140/100. Pt reports DFM today. No bleeding or LOF. Has not tried anything for headache.   Onset of complaint: ongoing  Pain score: 6/10  Vitals:   12/28/20 1147  BP: 128/73  Pulse: (!) 117  Resp: 16  Temp: 97.9 F (36.6 C)  SpO2: 97%     FHT: EFM applied in room  Lab orders placed from triage: UA

## 2020-12-28 NOTE — Discharge Summary (Signed)
Antenatal OB Discharge Summary  Patient Name: Karen Suarez DOB: [birth year] MRN: [id number]  Date of admission: 12/28/2020 Intrauterine pregnancy: [redacted]w[redacted]d   Admitting diagnosis: ELEV BP,HA Secondary diagnosis: None  Date of discharge: 12/28/2020    Discharge diagnosis: Gestational Hypertension     Prenatal history: G3P1011   EDC : 01/25/2021, by Other Basis  [facility] at [facility] Primary provider : [facility] Prenatal course complicated by [facility]. S>D  Prenatal Labs: ABO, Rh: --/--/A POS (11/08 1607)  Antibody: NEG (11/08 0649) Rubella: Immune    RPR:   NR HBsAg:   NR HIV:   NR GBS:     unknown                                 Hospital course:  Seen in MAU for b/p observation. Normotensive and stable labs. Offered 23 hour observation and pt declined. Pt discharged to home in stable condition.   Labs: Lab Results  Component Value Date   WBC 14.8 (H) 12/28/2020   HGB 11.0 (L) 12/28/2020   HCT 33.5 (L) 12/28/2020   MCV 84.6 12/28/2020   PLT 281 12/28/2020   CMP Latest Ref Rng & Units 12/28/2020  Glucose 70 - 99 mg/dL 84  BUN 6 - 20 mg/dL 7  Creatinine 3.71 - 0.62 mg/dL 6.94  Sodium 854 - 627 mmol/L 136  Potassium 3.5 - 5.1 mmol/L 4.0  Chloride 98 - 111 mmol/L 103  CO2 22 - 32 mmol/L 21(L)  Calcium 8.9 - 10.3 mg/dL 0.3(J)  Total Protein 6.5 - 8.1 g/dL 6.2(L)  Total Bilirubin 0.3 - 1.2 mg/dL 0.6  Alkaline Phos 38 - 126 U/L 130(H)  AST 15 - 41 U/L 19  ALT 0 - 44 U/L 21    Physical Exam @ time of discharge:  Vitals:   12/28/20 1331 12/28/20 1346 12/28/20 1400 12/28/20 1416  BP: 119/60 121/76 130/70 119/69  Pulse: (!) 109 (!) 110 (!) 104 92  Resp:      Temp:      TempSrc:      SpO2:   98%   Weight:      Height:       Physical Exam Vitals and nursing note reviewed.  Constitutional:      General: She is not in acute distress. Eyes:     Pupils: Pupils are equal, round, and reactive to light.  Cardiovascular:     Rate and Rhythm: Normal rate.  Pulmonary:      Effort: Pulmonary effort is normal.  Abdominal:     Palpations: Abdomen is soft.     Tenderness: There is no abdominal tenderness.  Musculoskeletal:     Right lower leg: No edema.     Left lower leg: No edema.  Skin:    General: Skin is warm and dry.  Neurological:     Mental Status: She is alert and oriented to person, place, and time.     Deep Tendon Reflexes: Reflexes normal.     Comments: No clonus   Psychiatric:        Behavior: Behavior normal.  NST:  FHR 140 with 15x15 accelerations, no decelerations and moderate variability.  Toco : none  Discharge instructions:  Preeclampsia precautions  Discharge Medications:  Allergies as of 12/28/2020   No Known Allergies      Medication List     TAKE these medications    fluticasone 50 MCG/ACT nasal  spray Commonly known as: FLONASE SPRAY 2 SPRAYS INTO EACH NOSTRIL EVERY DAY   multivitamin-prenatal 27-0.8 MG Tabs tablet Take 1 tablet by mouth daily at 12 noon.       Diet: routine diet  Follow up: 1 day with Dr Su Hilt at [facility]  Signed: Carollee Leitz MSN, CNM 12/28/2020, 2:39 PM

## 2020-12-28 NOTE — H&P (Signed)
OB ADMISSION/ HISTORY & PHYSICAL:  Admission Date: 12/28/2020 11:20 AM  Admit Diagnosis: Gestational HTN  Karen Suarez is a 25 y.o. female G3P1011 [redacted]w[redacted]d presenting for elevated b/p and HA. Endorses active FM, denies LOF, ctxs and vaginal bleeding.  History of current pregnancy: G3P1011   Primary OB Provider: [facility] Patient entered care with [facility] at 10.6 wks.   EDC 01/25/21 by 10.6 wk U/S. Unsure LMP Anatomy scan:  20.5 wks, complete w/ anterior placenta.   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   Acute rhinosinusitis 03/29/2020   Postpartum care following cesarean delivery 6/9 07/31/2019   Large for dates affecting management of mother, third trimester, not applicable or unspecified fetus 07/30/2019   S/P cesarean section 07/29/2019    Prenatal Labs: ABO, Rh: --/--/A POS (11/08 5009) Antibody: NEG (11/08 0649) Rubella:   Immune RPR:   NR HBsAg:   NR HIV:   NR GTT: 131 GBS:   Unknown GC/CHL: Negative/Negative Genetics: Low risk female/ negative AFP Tdap/influenza vaccines: UTD   OB History  Gravida Para Term Preterm AB Living  3 1 1  0 1 1  SAB IAB Ectopic Multiple Live Births  1 0 0 0 1    # Outcome Date GA Lbr Len/2nd Weight Sex Delivery Anes PTL Lv  3 Current           2 Term 07/29/19 [redacted]w[redacted]d  4050 g M CS-LTranv Spinal  LIV  1 SAB             Medical / Surgical History: Past medical history:  Past Medical History:  Diagnosis Date   Complication of anesthesia    hard to wake up   Family history of adverse reaction to anesthesia    pt's father has hx. of being hard to wake up post-op   Pregnancy induced hypertension    Tonsillar and adenoid hypertrophy 01/2015    Past surgical history:  Past Surgical History:  Procedure Laterality Date   CESAREAN SECTION N/A  07/29/2019   Procedure: Primary CESAREAN SECTION;  Surgeon: 09/28/2019, MD;  Location: [facility];  Service: Obstetrics;  Laterality: N/A;   NO PAST SURGERIES     TONSILLECTOMY AND ADENOIDECTOMY N/A 01/31/2015   Procedure: TONSILLECTOMY AND ADENOIDECTOMY;  Surgeon: 14/01/2015, MD;  Location: [facility];  Service: ENT;  Laterality: N/A;   Family History:  Family History  Problem Relation Age of Onset   Diabetes Maternal Grandfather    Diabetes Father    Anesthesia problems Father        hard to wake up post-op   Heart disease Father    Diabetes Paternal Grandfather     Social History:  reports that she has never smoked. She has never used smokeless tobacco. She reports that she does not drink alcohol and does not use drugs.  Allergies: Patient has no known allergies.   Current Medications at time of admission:  Prior to Admission medications   Medication Sig Start Date End Date Taking? Authorizing Provider  fluticasone (FLONASE) 50 MCG/ACT nasal spray SPRAY 2 SPRAYS INTO EACH NOSTRIL EVERY DAY 03/09/20   03/11/20, MD  Prenatal Vit-Fe Fumarate-FA (MULTIVITAMIN-PRENATAL) 27-0.8 MG TABS tablet Take 1 tablet by mouth daily at 12 noon.    [provider]    Review of Systems:  Constitutional: Negative   HENT: Negative   Eyes: Negative   Respiratory: Negative   Cardiovascular: Negative   Gastrointestinal: Negative  Genitourinary: negative for bloody show, negative for LOF   Musculoskeletal: Negative   Skin: Negative   Neurological: HA, DTR 2+/2+, negative clonus  Endo/Heme/Allergies: Negative   Psychiatric/Behavioral: Negative    Physical Exam: VS: Blood pressure 119/69, pulse 92, temperature 97.9 F (36.6 C), temperature source Oral, resp. rate 16, height 5\' 1"  (1.549 m), weight 104.6 kg, SpO2 98 %, unknown if currently breastfeeding. AAO x3, no signs of distress Cardiovascular: RRR Respiratory: Lung fields clear to ausculation GU/GI: Abdomen  gravid, non-tender, non-distended, active FM, vertex,  Extremities: negative edema, negative for pain, tenderness, and cords    FHR: baseline rate 140 / variability moderate / accelerations present / absent decelerations TOCO: none    Assessment/ Plan 25 y.o. G3P1011 [redacted]w[redacted]d seen in MAU for elevated B/P and HA. Normotensive B/P and stable labs. Reports HA is improving with tylenol. Declined 23 hour observation. Preeclampsia precautions reviewed with pt.  Pt has an appointment tomorrow with [facility]. Encouraged her to keep appointment. Pt discharged to home in stable condition.   Dr [redacted]w[redacted]d notified of MAU admit and plan of care  Normand Sloop MSN, CNM 12/28/2020 2:30 PM

## 2021-01-04 ENCOUNTER — Encounter (HOSPITAL_COMMUNITY)
Admission: RE | Disposition: A | Discharge status: Home / Self Care | Payer: Self-pay | Source: Home / Self Care | Attending: Obstetrics and Gynecology

## 2021-01-04 ENCOUNTER — Inpatient Hospital Stay (HOSPITAL_COMMUNITY): Payer: BC Managed Care – PPO | Admitting: Anesthesiology

## 2021-01-04 ENCOUNTER — Other Ambulatory Visit: Payer: Self-pay

## 2021-01-04 ENCOUNTER — Inpatient Hospital Stay (HOSPITAL_COMMUNITY)
Admission: RE | Admit: 2021-01-04 | Discharge: 2021-01-07 | DRG: 785 | Disposition: A | Discharge status: Home / Self Care | Payer: BC Managed Care – PPO | Attending: Obstetrics & Gynecology | Admitting: Obstetrics & Gynecology

## 2021-01-04 ENCOUNTER — Encounter (HOSPITAL_COMMUNITY): Payer: Self-pay | Admitting: Obstetrics and Gynecology

## 2021-01-04 DIAGNOSIS — Z302 Encounter for sterilization: Secondary | ICD-10-CM

## 2021-01-04 DIAGNOSIS — O34211 Maternal care for low transverse scar from previous cesarean delivery: Principal | ICD-10-CM | POA: Diagnosis present

## 2021-01-04 DIAGNOSIS — O134 Gestational [pregnancy-induced] hypertension without significant proteinuria, complicating childbirth: Secondary | ICD-10-CM | POA: Diagnosis present

## 2021-01-04 DIAGNOSIS — Z3A37 37 weeks gestation of pregnancy: Secondary | ICD-10-CM

## 2021-01-04 DIAGNOSIS — O139 Gestational [pregnancy-induced] hypertension without significant proteinuria, unspecified trimester: Secondary | ICD-10-CM | POA: Diagnosis present

## 2021-01-04 LAB — OB RESULTS CONSOLE GC/CHLAMYDIA
Chlamydia: NEGATIVE
Gonorrhea: NEGATIVE

## 2021-01-04 LAB — CBC
HCT: 33.6 % — ABNORMAL LOW (ref 36.0–46.0)
Hemoglobin: 10.9 g/dL — ABNORMAL LOW (ref 12.0–15.0)
MCH: 27.1 pg (ref 26.0–34.0)
MCHC: 32.4 g/dL (ref 30.0–36.0)
MCV: 83.6 fL (ref 80.0–100.0)
Platelets: 289 10*3/uL (ref 150–400)
RBC: 4.02 MIL/uL (ref 3.87–5.11)
RDW: 14.6 % (ref 11.5–15.5)
WBC: 11.9 10*3/uL — ABNORMAL HIGH (ref 4.0–10.5)
nRBC: 0 % (ref 0.0–0.2)

## 2021-01-04 LAB — OB RESULTS CONSOLE HIV ANTIBODY (ROUTINE TESTING): HIV: NONREACTIVE

## 2021-01-04 LAB — COMPREHENSIVE METABOLIC PANEL
ALT: 24 U/L (ref 0–44)
AST: 21 U/L (ref 15–41)
Albumin: 2.7 g/dL — ABNORMAL LOW (ref 3.5–5.0)
Alkaline Phosphatase: 122 U/L (ref 38–126)
Anion gap: 12 (ref 5–15)
BUN: 9 mg/dL (ref 6–20)
CO2: 20 mmol/L — ABNORMAL LOW (ref 22–32)
Calcium: 9 mg/dL (ref 8.9–10.3)
Chloride: 103 mmol/L (ref 98–111)
Creatinine, Ser: 0.72 mg/dL (ref 0.44–1.00)
GFR, Estimated: >60 mL/min (ref >60)
Glucose, Bld: 79 mg/dL (ref 70–99)
Potassium: 3.9 mmol/L (ref 3.5–5.1)
Sodium: 135 mmol/L (ref 135–145)
Total Bilirubin: 0.9 mg/dL (ref 0.3–1.2)
Total Protein: 5.8 g/dL — ABNORMAL LOW (ref 6.5–8.1)

## 2021-01-04 LAB — TYPE AND SCREEN
ABO/RH(D): A POS
Antibody Screen: NEGATIVE

## 2021-01-04 LAB — OB RESULTS CONSOLE GBS: GBS: NEGATIVE

## 2021-01-04 LAB — OB RESULTS CONSOLE RPR: RPR: NONREACTIVE

## 2021-01-04 LAB — OB RESULTS CONSOLE RUBELLA ANTIBODY, IGM: Rubella: IMMUNE

## 2021-01-04 LAB — OB RESULTS CONSOLE HEPATITIS B SURFACE ANTIGEN: Hepatitis B Surface Ag: NEGATIVE

## 2021-01-04 SURGERY — Surgical Case
Anesthesia: Spinal | Laterality: Bilateral

## 2021-01-04 MED ORDER — COCONUT OIL OIL: 1.0000 "application " | TOPICAL_OIL | Status: DC | PRN

## 2021-01-04 MED ORDER — PHENYLEPHRINE HCL-NACL 20-0.9 MG/250ML-% IV SOLN
INTRAVENOUS | Status: DC | PRN
  Administered 2021-01-04: 40 ug/min via INTRAVENOUS

## 2021-01-04 MED ORDER — DIPHENHYDRAMINE HCL 50 MG/ML IJ SOLN: 12.5000 mg | INTRAMUSCULAR | Status: DC | PRN

## 2021-01-04 MED ORDER — SCOPOLAMINE 1 MG/3DAYS TD PT72
MEDICATED_PATCH | TRANSDERMAL | Status: AC
  Filled 2021-01-04: qty 1

## 2021-01-04 MED ORDER — DIPHENHYDRAMINE HCL 25 MG PO CAPS: 25.0000 mg | ORAL_CAPSULE | ORAL | Status: DC | PRN

## 2021-01-04 MED ORDER — ACETAMINOPHEN 10 MG/ML IV SOLN
INTRAVENOUS | Status: AC
  Filled 2021-01-04: qty 100

## 2021-01-04 MED ORDER — SCOPOLAMINE 1 MG/3DAYS TD PT72
1.0000 | MEDICATED_PATCH | Freq: Once | TRANSDERMAL | Status: DC
  Administered 2021-01-04: 1.5 mg via TRANSDERMAL

## 2021-01-04 MED ORDER — POVIDONE-IODINE 10 % EX SWAB: 2.0000 "application " | Freq: Once | CUTANEOUS | Status: DC

## 2021-01-04 MED ORDER — DIPHENHYDRAMINE HCL 50 MG/ML IJ SOLN
INTRAMUSCULAR | Status: AC
  Filled 2021-01-04: qty 1

## 2021-01-04 MED ORDER — KETOROLAC TROMETHAMINE 30 MG/ML IJ SOLN
INTRAMUSCULAR | Status: AC
  Filled 2021-01-04: qty 1

## 2021-01-04 MED ORDER — NALBUPHINE HCL 10 MG/ML IJ SOLN
5.0000 mg | INTRAMUSCULAR | Status: DC | PRN
  Administered 2021-01-05 (×3): 5 mg via INTRAVENOUS
  Filled 2021-01-04 (×3): qty 1

## 2021-01-04 MED ORDER — LACTATED RINGERS IV SOLN: INTRAVENOUS | Status: DC

## 2021-01-04 MED ORDER — ONDANSETRON HCL 4 MG/2ML IJ SOLN
4.0000 mg | Freq: Three times a day (TID) | INTRAMUSCULAR | Status: DC | PRN
  Administered 2021-01-04: 4 mg via INTRAVENOUS
  Filled 2021-01-04: qty 2

## 2021-01-04 MED ORDER — KETOROLAC TROMETHAMINE 30 MG/ML IJ SOLN: 30.0000 mg | Freq: Four times a day (QID) | INTRAMUSCULAR | Status: DC | PRN

## 2021-01-04 MED ORDER — ACETAMINOPHEN 500 MG PO TABS: 1000.0000 mg | ORAL_TABLET | Freq: Four times a day (QID) | ORAL | Status: AC

## 2021-01-04 MED ORDER — NALBUPHINE HCL 10 MG/ML IJ SOLN: 5.0000 mg | Freq: Once | INTRAMUSCULAR | Status: AC | PRN

## 2021-01-04 MED ORDER — FENTANYL CITRATE (PF) 100 MCG/2ML IJ SOLN
INTRAMUSCULAR | Status: AC
  Filled 2021-01-04: qty 2

## 2021-01-04 MED ORDER — NALBUPHINE HCL 10 MG/ML IJ SOLN
5.0000 mg | Freq: Once | INTRAMUSCULAR | Status: AC | PRN
  Administered 2021-01-04: 5 mg via INTRAVENOUS

## 2021-01-04 MED ORDER — ONDANSETRON HCL 4 MG/2ML IJ SOLN
INTRAMUSCULAR | Status: DC | PRN
  Administered 2021-01-04: 4 mg via INTRAVENOUS

## 2021-01-04 MED ORDER — SODIUM CHLORIDE 0.9% FLUSH: 3.0000 mL | INTRAVENOUS | Status: DC | PRN

## 2021-01-04 MED ORDER — OXYTOCIN-SODIUM CHLORIDE 30-0.9 UT/500ML-% IV SOLN
INTRAVENOUS | Status: DC | PRN
  Administered 2021-01-04: 50 mL/h via INTRAVENOUS

## 2021-01-04 MED ORDER — PHENYLEPHRINE 40 MCG/ML (10ML) SYRINGE FOR IV PUSH (FOR BLOOD PRESSURE SUPPORT)
PREFILLED_SYRINGE | INTRAVENOUS | Status: DC | PRN
  Administered 2021-01-04: 120 ug via INTRAVENOUS
  Administered 2021-01-04 (×2): 80 ug via INTRAVENOUS

## 2021-01-04 MED ORDER — SIMETHICONE 80 MG PO CHEW
80.0000 mg | CHEWABLE_TABLET | ORAL | Status: DC | PRN
  Administered 2021-01-06 (×2): 80 mg via ORAL
  Filled 2021-01-04 (×2): qty 1

## 2021-01-04 MED ORDER — CEFAZOLIN SODIUM-DEXTROSE 2-4 GM/100ML-% IV SOLN
2.0000 g | INTRAVENOUS | Status: AC
  Administered 2021-01-04: 2 g via INTRAVENOUS

## 2021-01-04 MED ORDER — SENNOSIDES-DOCUSATE SODIUM 8.6-50 MG PO TABS
2.0000 | ORAL_TABLET | ORAL | Status: DC
  Administered 2021-01-05 – 2021-01-06 (×2): 2 via ORAL
  Filled 2021-01-04 (×2): qty 2

## 2021-01-04 MED ORDER — NALBUPHINE HCL 10 MG/ML IJ SOLN
INTRAMUSCULAR | Status: AC
  Filled 2021-01-04: qty 1

## 2021-01-04 MED ORDER — NALOXONE HCL 4 MG/10ML IJ SOLN
1.0000 ug/kg/h | INTRAVENOUS | Status: DC | PRN
  Filled 2021-01-04: qty 5

## 2021-01-04 MED ORDER — DIPHENHYDRAMINE HCL 50 MG/ML IJ SOLN
INTRAMUSCULAR | Status: DC | PRN
  Administered 2021-01-04: 6.25 mg via INTRAVENOUS

## 2021-01-04 MED ORDER — PRENATAL MULTIVITAMIN CH
1.0000 | ORAL_TABLET | Freq: Every day | ORAL | Status: DC
  Administered 2021-01-05 – 2021-01-06 (×2): 1 via ORAL
  Filled 2021-01-04: qty 1

## 2021-01-04 MED ORDER — KETOROLAC TROMETHAMINE 30 MG/ML IJ SOLN
30.0000 mg | Freq: Once | INTRAMUSCULAR | Status: AC | PRN
  Administered 2021-01-04: 30 mg via INTRAVENOUS

## 2021-01-04 MED ORDER — FENTANYL CITRATE (PF) 100 MCG/2ML IJ SOLN: 25.0000 ug | INTRAMUSCULAR | Status: DC | PRN

## 2021-01-04 MED ORDER — DIPHENHYDRAMINE HCL 25 MG PO CAPS: 25.0000 mg | ORAL_CAPSULE | Freq: Four times a day (QID) | ORAL | Status: DC | PRN

## 2021-01-04 MED ORDER — OXYCODONE-ACETAMINOPHEN 5-325 MG PO TABS
1.0000 | ORAL_TABLET | ORAL | Status: DC | PRN
  Administered 2021-01-05 (×3): 1 via ORAL
  Filled 2021-01-04: qty 1
  Filled 2021-01-04: qty 2

## 2021-01-04 MED ORDER — ACETAMINOPHEN 10 MG/ML IV SOLN
1000.0000 mg | Freq: Once | INTRAVENOUS | Status: DC | PRN
  Administered 2021-01-04: 1000 mg via INTRAVENOUS

## 2021-01-04 MED ORDER — MORPHINE SULFATE (PF) 0.5 MG/ML IJ SOLN
INTRAMUSCULAR | Status: AC
  Filled 2021-01-04: qty 10

## 2021-01-04 MED ORDER — NALBUPHINE HCL 10 MG/ML IJ SOLN: 5.0000 mg | INTRAMUSCULAR | Status: DC | PRN

## 2021-01-04 MED ORDER — MENTHOL 3 MG MT LOZG: 1.0000 | LOZENGE | OROMUCOSAL | Status: DC | PRN

## 2021-01-04 MED ORDER — DEXAMETHASONE SODIUM PHOSPHATE 10 MG/ML IJ SOLN
INTRAMUSCULAR | Status: DC | PRN
  Administered 2021-01-04: 4 mg via INTRAVENOUS

## 2021-01-04 MED ORDER — KETOROLAC TROMETHAMINE 30 MG/ML IJ SOLN
30.0000 mg | Freq: Four times a day (QID) | INTRAMUSCULAR | Status: DC | PRN
  Administered 2021-01-05: 13:00:00 30 mg via INTRAVENOUS
  Filled 2021-01-04: qty 1

## 2021-01-04 MED ORDER — CEFAZOLIN SODIUM-DEXTROSE 2-4 GM/100ML-% IV SOLN
INTRAVENOUS | Status: AC
  Filled 2021-01-04: qty 100

## 2021-01-04 MED ORDER — WITCH HAZEL-GLYCERIN EX PADS: 1.0000 "application " | MEDICATED_PAD | CUTANEOUS | Status: DC | PRN

## 2021-01-04 MED ORDER — FLUTICASONE PROPIONATE 50 MCG/ACT NA SUSP
2.0000 | Freq: Every day | NASAL | Status: DC | PRN
  Filled 2021-01-04: qty 16

## 2021-01-04 MED ORDER — PHENYLEPHRINE 40 MCG/ML (10ML) SYRINGE FOR IV PUSH (FOR BLOOD PRESSURE SUPPORT)
PREFILLED_SYRINGE | INTRAVENOUS | Status: AC
  Filled 2021-01-04: qty 10

## 2021-01-04 MED ORDER — ZOLPIDEM TARTRATE 5 MG PO TABS: 5.0000 mg | ORAL_TABLET | Freq: Every evening | ORAL | Status: DC | PRN

## 2021-01-04 MED ORDER — NALOXONE HCL 0.4 MG/ML IJ SOLN: 0.4000 mg | INTRAMUSCULAR | Status: DC | PRN

## 2021-01-04 MED ORDER — SODIUM CHLORIDE 0.9 % IR SOLN
Status: DC | PRN
  Administered 2021-01-04: 250 mL

## 2021-01-04 MED ORDER — SIMETHICONE 80 MG PO CHEW
80.0000 mg | CHEWABLE_TABLET | Freq: Three times a day (TID) | ORAL | Status: DC
  Administered 2021-01-05 – 2021-01-06 (×6): 80 mg via ORAL
  Filled 2021-01-04 (×6): qty 1

## 2021-01-04 MED ORDER — OXYTOCIN-SODIUM CHLORIDE 30-0.9 UT/500ML-% IV SOLN: 2.5000 [IU]/h | INTRAVENOUS | Status: AC

## 2021-01-04 MED ORDER — DIBUCAINE (PERIANAL) 1 % EX OINT: 1.0000 "application " | TOPICAL_OINTMENT | CUTANEOUS | Status: DC | PRN

## 2021-01-04 SURGICAL SUPPLY — 36 items
BENZOIN TINCTURE PRP APPL 2/3 (GAUZE/BANDAGES/DRESSINGS) ×2 IMPLANT
CLAMP CORD UMBIL (MISCELLANEOUS) IMPLANT
CLOSURE STERI STRIP 1/2 X4 (GAUZE/BANDAGES/DRESSINGS) ×2 IMPLANT
CLOTH BEACON ORANGE TIMEOUT ST (SAFETY) ×2 IMPLANT
DRAIN JACKSON PRT FLT 10 (DRAIN) IMPLANT
DRSG OPSITE POSTOP 4X10 (GAUZE/BANDAGES/DRESSINGS) ×2 IMPLANT
DRSG OPSITE POSTOP 4X12 (GAUZE/BANDAGES/DRESSINGS) ×2 IMPLANT
ELECT REM PT RETURN 9FT ADLT (ELECTROSURGICAL) ×2
ELECTRODE REM PT RTRN 9FT ADLT (ELECTROSURGICAL) ×1 IMPLANT
EVACUATOR SILICONE 100CC (DRAIN) IMPLANT
EXTRACTOR VACUUM M CUP 4 TUBE (SUCTIONS) IMPLANT
GLOVE BIO SURGEON STRL SZ 6.5 (GLOVE) ×2 IMPLANT
GLOVE BIOGEL PI IND STRL 7.0 (GLOVE) ×2 IMPLANT
GLOVE BIOGEL PI INDICATOR 7.0 (GLOVE) ×2
GOWN STRL REUS W/TWL LRG LVL3 (GOWN DISPOSABLE) ×4 IMPLANT
KIT ABG SYR 3ML LUER SLIP (SYRINGE) IMPLANT
NEEDLE HYPO 25X5/8 SAFETYGLIDE (NEEDLE) IMPLANT
NS IRRIG 1000ML POUR BTL (IV SOLUTION) ×2 IMPLANT
PACK C SECTION WH (CUSTOM PROCEDURE TRAY) ×2 IMPLANT
PAD OB MATERNITY 4.3X12.25 (PERSONAL CARE ITEMS) ×2 IMPLANT
PENCIL SMOKE EVAC W/HOLSTER (ELECTROSURGICAL) ×2 IMPLANT
RTRCTR C-SECT PINK 25CM LRG (MISCELLANEOUS) IMPLANT
STRIP CLOSURE SKIN 1/2X4 (GAUZE/BANDAGES/DRESSINGS) ×2 IMPLANT
SUT CHROMIC 0 CT 1 (SUTURE) ×2 IMPLANT
SUT MNCRL AB 3-0 PS2 27 (SUTURE) ×2 IMPLANT
SUT PLAIN 2 0 (SUTURE) ×4
SUT PLAIN 2 0 XLH (SUTURE) ×2 IMPLANT
SUT PLAIN ABS 2-0 CT1 27XMFL (SUTURE) ×2 IMPLANT
SUT SILK 2 0 SH (SUTURE) IMPLANT
SUT VIC AB 0 CTX 36 (SUTURE) ×8
SUT VIC AB 0 CTX36XBRD ANBCTRL (SUTURE) ×4 IMPLANT
SUT VIC AB 2-0 SH 27 (SUTURE)
SUT VIC AB 2-0 SH 27XBRD (SUTURE) IMPLANT
TOWEL OR 17X24 6PK STRL BLUE (TOWEL DISPOSABLE) ×2 IMPLANT
TRAY FOLEY W/BAG SLVR 14FR LF (SET/KITS/TRAYS/PACK) ×2 IMPLANT
WATER STERILE IRR 1000ML POUR (IV SOLUTION) ×2 IMPLANT

## 2021-01-04 NOTE — H&P (Signed)
OB ADMISSION/ HISTORY & PHYSICAL:  Admission Date: 01/04/2021 12:13 PM  Admit Diagnosis: Repeat cesarean with bilateral tubal sterilization  Karen Suarez is a 25 y.o. female G3P1011 [redacted]w[redacted]d presenting for repeat cesarean . Endorses active FM, denies LOF, ctx and vaginal bleeding. Hx of GHTN during this pregnancy.   History of current pregnancy: G3P1011   Primary OB Provider: [facility] Patient entered care with [facility] at 10.6 wks.   EDC 01/25/21 by [place] @ 10.6wks, Unsure LMP  Anatomy scan:  20.5 wks, complete w/ anterior placenta.   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   Acute rhinosinusitis 03/29/2020   Postpartum care following cesarean delivery 6/9 07/31/2019   Large for dates affecting management of mother, third trimester, not applicable or unspecified fetus 07/30/2019   S/P cesarean section 07/29/2019    Prenatal Labs: ABO, Rh: --/--/A POS (11/16 1248) Antibody: NEG (11/16 1248) Rubella:   Immune RPR:   NR HBsAg:   NR HIV:   NR GTT: 131 GBS: Negative GC/CHL: Negative/Negative Genetics:   Low risk female/ negative AFP Tdap/influenza vaccines: UTD   OB History  Gravida Para Term Preterm AB Living  3 1 1  0 1 1  SAB IAB Ectopic Multiple Live Births  1 0 0 0 1    # Outcome Date GA Lbr Len/2nd Weight Sex Delivery Anes PTL Lv  3 Current           2 Term 07/29/19 [redacted]w[redacted]d  4050 g M CS-LTranv Spinal  LIV  1 SAB             Medical / Surgical History: Past medical history:  Past Medical History:  Diagnosis Date   Complication of anesthesia    hard to wake up   Family history of adverse reaction to anesthesia    pt's father has hx. of being hard to wake up post-op   Pregnancy induced  hypertension    Tonsillar and adenoid hypertrophy 01/2015    Past surgical history:  Past Surgical History:  Procedure Laterality Date   CESAREAN SECTION N/A 07/29/2019   Procedure: Primary CESAREAN SECTION;  Surgeon: 09/28/2019, MD;  Location: [facility];  Service: Obstetrics;  Laterality: N/A;   NO PAST SURGERIES     TONSILLECTOMY AND ADENOIDECTOMY N/A 01/31/2015   Procedure: TONSILLECTOMY AND ADENOIDECTOMY;  Surgeon: 14/01/2015, MD;  Location: [facility];  Service: ENT;  Laterality: N/A;   Family History:  Family History  Problem Relation Age of Onset   Diabetes Maternal Grandfather    Diabetes Father    Anesthesia problems Father        hard to wake up post-op   Heart disease Father    Diabetes Paternal Grandfather     Social History:  reports that she has never smoked. She has never used smokeless tobacco. She reports that she does not drink alcohol and does not use drugs.  Allergies: Patient has no known allergies.   Current Medications at time of admission:  Prior to Admission medications   Medication Sig Start Date End Date Taking? Authorizing Provider  acetaminophen (TYLENOL) 500 MG tablet Take 500-1,000 mg by mouth every 6 (six) hours as needed (pain).   Yes [provider]  calcium  carbonate (TUMS - DOSED IN MG ELEMENTAL CALCIUM) 500 MG chewable tablet Chew 3 tablets by mouth at bedtime as needed for indigestion or heartburn.   Yes [provider]  CVS D3 125 MCG (5000 UT) capsule Take 5,000 Units by mouth daily with breakfast. 10/21/20  Yes [provider]  fluticasone (FLONASE) 50 MCG/ACT nasal spray SPRAY 2 SPRAYS INTO EACH NOSTRIL EVERY DAY Patient taking differently: Place 2 sprays into both nostrils daily as needed for allergies. 03/09/20  Yes Babs Sciara, MD  Prenatal Vit-Fe Fumarate-FA (MULTIVITAMIN-PRENATAL) 27-0.8 MG TABS tablet Take 1 tablet by mouth daily with breakfast.   Yes [provider]    Review of  Systems: Constitutional: Negative   HENT: Negative   Eyes: positive for blurry vision Respiratory: Negative   Cardiovascular: Negative   Gastrointestinal: Negative  Genitourinary: negative for bloody show, negative for LOF   Musculoskeletal: Negative   Skin: Negative   Neurological: Negative   Endo/Heme/Allergies: Negative   Psychiatric/Behavioral: Negative    Physical Exam: VS: Blood pressure 135/90, pulse 89, temperature 98.3 F (36.8 C), temperature source Oral, resp. rate 18, height 5\' 1"  (1.549 m), weight 106.6 kg, not currently breastfeeding. AAO x3, no signs of distress Cardiovascular: RRR Respiratory: Lung fields clear to ausculation GU/GI: Abdomen gravid, non-tender, non-distended, active FM, vertex Extremities: negative edema, negative for pain, tenderness, and cords  Doppler: 158   Prenatal Transfer Tool  Maternal Diabetes: No Genetic Screening: Normal Maternal Ultrasounds/Referrals: Normal Fetal Ultrasounds or other Referrals:  None Maternal Substance Abuse:  No Significant Maternal Medications:  None Significant Maternal Lab Results: Group B Strep negative    Assessment: 25 y.o. G3P1011 [redacted]w[redacted]d Repeat cesarean section with bilateral tubal sterilization Hx of GHTN this pregnancy - denies HA, continued visual blurriness and denies epigastric pain.     Plan:  Admit to L&D preop Routine cesarean with bilateral tubal sterilization admission orders   Dr [redacted]w[redacted]d notified of admission and plan of care  Normand Sloop MSN, CNM 01/04/2021 1:48 PM

## 2021-01-04 NOTE — Op Note (Signed)
Cesarean Section Procedure Note   Karen Suarez  01/04/2021  Indications: Scheduled Proceedure/Maternal Request and Gestational HTN   Desires sterilization  Pre-operative Diagnosis: same  Post-operative Diagnosis: Same   Surgeon Shannan Slinker  Procedure Repeat LTCS with two layer closure and BTL  Assistants: kIm Holshouser  Anesthesia: spinal   Procedure Details:  The patient was seen in the Holding Room. The risks, benefits, complications, treatment options, and expected outcomes were discussed with the patient. The patient concurred with the proposed plan, giving informed consent. identified as [name] and the procedure verified as C-Section Delivery. A Time Out was held and the above information confirmed.  After induction of anesthesia, the patient was draped and prepped in the usual sterile manner. A transverse incision was made and carried down through the subcutaneous tissue to the fascia. Fascial incision was made and extended transversely. The fascia was separated from the underlying rectus tissue superiorly and inferiorly. The peritoneum was identified and entered. Peritoneal incision was extended longitudinally. The utero-vesical peritoneal reflection was incised transversely and the bladder flap was bluntly freed from the lower uterine segment. A low transverse uterine incision was made. Delivered from cephalic presentation was a 8-10 pound Female with Apgar scores of 8 at one minute and 8 at five minutes. Cord ph was not sent the umbilical cord was clamped and cut cord blood was obtained for evaluation. The placenta was removed Intact and appeared normal. The uterine outline, tubes and ovaries appeared normal}. The uterine incision was closed with running locked sutures of 0Vicryl. A second layer of 0 vicryl was used to imbricate the uterus.  The patients left fallopian tube was grasped at the mid ishtmic portion with babcock clamp, ligated with 2-0 plain and excised.   The patients right fallopian tube was followed out to the fimbriated end.  The mid isthmic portion of the tube was ligated with 2-0 plain and excised.  Both portions of tubes was sent to pathology.     Hemostasis was observed. Lavage was carried out until clear. The fascia was then reapproximated with running sutures of 0Vicryl. The subcuticular closure was performed using 2-0plain gut. The skin was closed with 4-62fast absorbing plain gut.   Instrument, sponge, and needle counts were correct prior the abdominal closure and were correct at the conclusion of the case.    Findings: female infant vtx presentation.  Clear fluid. Normal appearing pelvic anatomy   Estimated Blood Loss 149 cc  Total IV Fluids: see recordml   Urine Output:  clear urine see record  Specimen  placenta   Complications: no complications  Disposition: PACU - hemodynamically stable.   Maternal Condition: stable   Baby condition / location:   NICU he had fluid continually coming out of mouth and nose and mild respiratory distress.    Attending Attestation: I performed the procedure. My assistant was needed for the delivery and completion of th procedure  Signed: Surgeon(s): Jaymes Graff, MD

## 2021-01-04 NOTE — Anesthesia Preprocedure Evaluation (Signed)
Anesthesia Evaluation  Patient identified by MRN, date of birth, ID band Patient awake    Reviewed: Allergy & Precautions, NPO status , Patient's Chart, lab work & pertinent test results  Airway Mallampati: III  TM Distance: >3 FB Neck ROM: Full    Dental no notable dental hx.    Pulmonary neg pulmonary ROS,    Pulmonary exam normal breath sounds clear to auscultation       Cardiovascular hypertension (gHTN), Normal cardiovascular exam Rhythm:Regular Rate:Normal     Neuro/Psych negative neurological ROS  negative psych ROS   GI/Hepatic negative GI ROS, Neg liver ROS,   Endo/Other  Morbid obesity (BMI 44)  Renal/GU negative Renal ROS  negative genitourinary   Musculoskeletal negative musculoskeletal ROS (+)   Abdominal   Peds  Hematology negative hematology ROS (+)   Anesthesia Other Findings Repeat C/S  Reproductive/Obstetrics (+) Pregnancy                             Anesthesia Physical Anesthesia Plan  ASA: 3  Anesthesia Plan: Spinal   Post-op Pain Management:    Induction:   PONV Risk Score and Plan: Treatment may vary due to age or medical condition  Airway Management Planned: Natural Airway  Additional Equipment:   Intra-op Plan:   Post-operative Plan:   Informed Consent: I have reviewed the patients History and Physical, chart, labs and discussed the procedure including the risks, benefits and alternatives for the proposed anesthesia with the patient or authorized representative who has indicated his/her understanding and acceptance.     Dental advisory given  Plan Discussed with: CRNA  Anesthesia Plan Comments:         Anesthesia Quick Evaluation

## 2021-01-04 NOTE — Transfer of Care (Signed)
Immediate Anesthesia Transfer of Care Note  Patient: Karen Suarez  Procedure(s) Performed: CESAREAN SECTION WITH BILATERAL TUBAL LIGATION (Bilateral)  Patient Location: PACU  Anesthesia Type:Regional and Spinal  Level of Consciousness: awake, alert , oriented and patient cooperative  Airway & Oxygen Therapy: Patient Spontanous Breathing  Post-op Assessment: Report given to RN and Post -op Vital signs reviewed and stable  Post vital signs: Reviewed and stable  Last Vitals:  Vitals Value Taken Time  BP 135/72 01/04/21 1640  Temp    Pulse 85 01/04/21 1643  Resp 17 01/04/21 1643  SpO2 99 % 01/04/21 1643  Vitals shown include unvalidated device data.  Last Pain:  Vitals:   01/04/21 1301  TempSrc:   PainSc: 0-No pain         Complications: No notable events documented.

## 2021-01-05 LAB — CBC
HCT: 30.7 % — ABNORMAL LOW (ref 36.0–46.0)
Hemoglobin: 10.4 g/dL — ABNORMAL LOW (ref 12.0–15.0)
MCH: 28.2 pg (ref 26.0–34.0)
MCHC: 33.9 g/dL (ref 30.0–36.0)
MCV: 83.2 fL (ref 80.0–100.0)
Platelets: 247 10*3/uL (ref 150–400)
RBC: 3.69 MIL/uL — ABNORMAL LOW (ref 3.87–5.11)
RDW: 14.6 % (ref 11.5–15.5)
WBC: 14.3 10*3/uL — ABNORMAL HIGH (ref 4.0–10.5)
nRBC: 0 % (ref 0.0–0.2)

## 2021-01-05 LAB — COMPREHENSIVE METABOLIC PANEL
ALT: 23 U/L (ref 0–44)
AST: 23 U/L (ref 15–41)
Albumin: 2.4 g/dL — ABNORMAL LOW (ref 3.5–5.0)
Alkaline Phosphatase: 111 U/L (ref 38–126)
Anion gap: 10 (ref 5–15)
BUN: 7 mg/dL (ref 6–20)
CO2: 22 mmol/L (ref 22–32)
Calcium: 8.6 mg/dL — ABNORMAL LOW (ref 8.9–10.3)
Chloride: 101 mmol/L (ref 98–111)
Creatinine, Ser: 0.7 mg/dL (ref 0.44–1.00)
GFR, Estimated: >60 mL/min (ref >60)
Glucose, Bld: 119 mg/dL — ABNORMAL HIGH (ref 70–99)
Potassium: 4 mmol/L (ref 3.5–5.1)
Sodium: 133 mmol/L — ABNORMAL LOW (ref 135–145)
Total Bilirubin: 0.8 mg/dL (ref 0.3–1.2)
Total Protein: 5.6 g/dL — ABNORMAL LOW (ref 6.5–8.1)

## 2021-01-05 LAB — PROTEIN / CREATININE RATIO, URINE
Creatinine, Urine: 46.09 mg/dL
Total Protein, Urine: <6 mg/dL

## 2021-01-05 LAB — RPR: RPR Ser Ql: NONREACTIVE

## 2021-01-05 MED ORDER — OXYCODONE HCL 5 MG PO TABS
5.0000 mg | ORAL_TABLET | ORAL | Status: DC | PRN
Start: 2021-01-05 — End: 2021-01-06
  Administered 2021-01-05 – 2021-01-06 (×3): 10 mg via ORAL
  Filled 2021-01-05 (×3): qty 2

## 2021-01-05 MED ORDER — IBUPROFEN 600 MG PO TABS
600.0000 mg | ORAL_TABLET | Freq: Four times a day (QID) | ORAL | Status: DC
  Administered 2021-01-06: 600 mg via ORAL
  Filled 2021-01-05 (×3): qty 1

## 2021-01-05 MED ORDER — ACETAMINOPHEN 500 MG PO TABS
1000.0000 mg | ORAL_TABLET | Freq: Four times a day (QID) | ORAL | Status: DC
  Administered 2021-01-06 – 2021-01-07 (×4): 1000 mg via ORAL
  Filled 2021-01-05 (×5): qty 2

## 2021-01-05 MED ORDER — ACETAMINOPHEN 500 MG PO TABS: 1000.0000 mg | ORAL_TABLET | Freq: Four times a day (QID) | ORAL | Status: DC | PRN

## 2021-01-05 NOTE — Progress Notes (Signed)
Subjective:    POD# 1 Information for the patient's newborn:  Karen Suarez, Karen Suarez [[id number]]  female    Circumcision desires inpatient  Reports feeling "better today with sleep" Feeding: breast Reports tolerating PO and denies N/V, foley removed, ambulating and urinating w/o difficulty  Pain controlled with  PO medications Denies HA/SOB/dizziness  Flatus yes Vaginal bleeding is normal, no clots     Objective:  VS:  Vitals:   01/04/21 2025 01/05/21 0130 01/05/21 0544 01/05/21 0646  BP:  137/64 (!) 108/54   Pulse:  79 79   Resp: 16 16 16    Temp: 98 F (36.7 C) 97.6 F (36.4 C) 98.2 F (36.8 C)   TempSrc: Oral Oral Oral   SpO2:      Weight:    99 kg  Height:        Intake/Output Summary (Last 24 hours) at 01/05/2021 0951 Last data filed at 01/04/2021 2145 Gross per 24 hour  Intake 1200 ml  Output 1024 ml  Net 176 ml     Recent Labs    01/04/21 1248 01/05/21 0510  WBC 11.9* 14.3*  HGB 10.9* 10.4*  HCT 33.6* 30.7*  PLT 289 247    Blood type: --/--/A POS (11/16 1248) Rubella: Immune (11/16 0000)    Physical Exam:  General: alert and cooperative Abdomen: soft, nontender, normal bowel sounds Incision: dressing clean, dry, and intact Perineum: intact Uterine Fundus: firm, below umbilicus, nontender Lochia: slight Ext: extremities normal, atraumatic, no cyanosis or edema   Assessment/Plan: 25 y.o.   POD# 1. 34                  Active Problems:   Encounter for maternal care for low transverse scar from repeat cesarean delivery   Gestational HTN   Routine post-op PP care          Advance diet as tolerated Advised warm fluids and ambulation to improve GI motility Breastfeeding support Anticipate D/C 01/06/2021  01/08/2021, MSN, CNM 01/05/2021, 9:51 AM

## 2021-01-05 NOTE — Anesthesia Postprocedure Evaluation (Signed)
Anesthesia Post Note  Patient: Radio broadcast assistant  Procedure(s) Performed: CESAREAN SECTION WITH BILATERAL TUBAL LIGATION (Bilateral)     Patient location during evaluation: PACU Anesthesia Type: Spinal Level of consciousness: oriented and awake and alert Pain management: pain level controlled Vital Signs Assessment: post-procedure vital signs reviewed and stable Respiratory status: spontaneous breathing, respiratory function stable and patient connected to nasal cannula oxygen Cardiovascular status: blood pressure returned to baseline and stable Postop Assessment: no headache, no backache and no apparent nausea or vomiting Anesthetic complications: no   No notable events documented.  Last Vitals:  Vitals:   01/05/21 0130 01/05/21 0544  BP: 137/64 (!) 108/54  Pulse: 79 79  Resp: 16 16  Temp: 36.4 C 36.8 C  SpO2:      Last Pain:  Vitals:   01/05/21 0544  TempSrc: Oral  PainSc: 6                  Maikel Neisler L Anesa Fronek

## 2021-01-06 ENCOUNTER — Encounter (HOSPITAL_COMMUNITY): Payer: Self-pay | Admitting: Obstetrics and Gynecology

## 2021-01-06 MED ORDER — GABAPENTIN 300 MG PO CAPS
300.0000 mg | ORAL_CAPSULE | Freq: Three times a day (TID) | ORAL | Status: DC
  Administered 2021-01-06 – 2021-01-07 (×3): 300 mg via ORAL
  Filled 2021-01-06 (×3): qty 1

## 2021-01-06 MED ORDER — OXYCODONE HCL 5 MG PO TABS
5.0000 mg | ORAL_TABLET | ORAL | Status: DC | PRN
Start: 2021-01-06 — End: 2021-01-07
  Administered 2021-01-07: 5 mg via ORAL
  Filled 2021-01-06: qty 1

## 2021-01-06 MED ORDER — IBUPROFEN 800 MG PO TABS
800.0000 mg | ORAL_TABLET | Freq: Three times a day (TID) | ORAL | Status: DC
  Administered 2021-01-06 – 2021-01-07 (×3): 800 mg via ORAL
  Filled 2021-01-06 (×3): qty 1

## 2021-01-06 MED ORDER — OXYCODONE HCL 5 MG PO TABS: 15.0000 mg | ORAL_TABLET | ORAL | Status: DC

## 2021-01-06 NOTE — Progress Notes (Signed)
MD Progress Note  Karen Suarez [id number] POD# 2 after repeat CS PQD:[id number] repeat Information for the patient's newborn:  Raelene, Trew [[id number]]  female   Subjective: Patient reports  that her pain is severe, and not well controlled Feeding: bottle Reports tolerating PO, no N/V, ambulating and voiding w/o difficulty  Patient notes that she is asking for percocet Denies chest pain HA/SOB/dizziness  Flatus positive Vaginal bleeding is normal, no clots   Objective:  VS:  Vitals:   01/05/21 0901 01/05/21 1313 01/05/21 2327 01/06/21 0540  BP: 112/67 132/71 124/71 119/76  Pulse: 68 69 73 75  Resp: 17 16 16 16   Temp: 98.2 F (36.8 C) 97.8 F (36.6 C) 98.3 F (36.8 C) 97.7 F (36.5 C)  TempSrc: Oral Oral Oral Oral  SpO2: 97% 100%    Weight:      Height:       No intake or output data in the 24 hours ending 01/06/21 1044   Recent Labs    01/04/21 1248 01/05/21 0510  WBC 11.9* 14.3*  HGB 10.9* 10.4*  HCT 33.6* 30.7*  PLT 289 247    Blood type: --/--/A POS (11/16 1248) Rubella: Immune (11/16 0000)    Physical Exam:  General: alert, cooperative, and no distress Abdomen: tender to palpation no rebound or guarding Incision: clean, dry, intact, and honeycomb dressing on Uterine Fundus: firm, below umbilicus, nontender Lochia: none Ext: extremities normal, atraumatic, no cyanosis or edema, edema none, and Homans sign is negative, no sign of DVT   Assessment/Plan: 25 y.o.   POD# 2. 25 with uncontrolled pain                 Active Problems:   Encounter for maternal care for low transverse scar from repeat cesarean delivery   Gestational HTN  Will add gabapentin to her pain regimen, make pain meds not prn Routine post-op PP care          Regular diet Anticipate D/C tomorrow  [id number], MD 01/06/2021, 10:44 AM

## 2021-01-07 MED ORDER — VALACYCLOVIR HCL 1 G PO TABS: ORAL_TABLET | ORAL | 0 refills | Status: DC

## 2021-01-07 MED ORDER — OXYCODONE HCL 5 MG PO TABS: 5.0000 mg | ORAL_TABLET | ORAL | 0 refills | Status: DC | PRN

## 2021-01-07 MED ORDER — VALACYCLOVIR HCL 500 MG PO TABS: 2000.0000 mg | ORAL_TABLET | Freq: Once | ORAL | Status: DC

## 2021-01-07 MED ORDER — GABAPENTIN 300 MG PO CAPS: 300.0000 mg | ORAL_CAPSULE | Freq: Three times a day (TID) | ORAL | 0 refills | Status: DC | PRN

## 2021-01-07 MED ORDER — IBUPROFEN 800 MG PO TABS
800.0000 mg | ORAL_TABLET | Freq: Three times a day (TID) | ORAL | 1 refills | Status: DC | PRN
Start: 2021-01-07 — End: 2021-03-31

## 2021-01-07 NOTE — Discharge Summary (Signed)
[facility] Discharge Summary   Patient Name:   Karen Suarez DOB:     December 17, 1995 MRN:     [id number]  Date of Admission:   01/04/2021 Date of Discharge:  01/07/2021  Admitting diagnosis:    Encounter for maternal care for low transverse scar from repeat cesarean delivery [O34.211] Gestational HTN [O13.9] Active Problems:   Encounter for maternal care for low transverse scar from repeat cesarean delivery   Gestational HTN  Term Pregnancy Delivered    Discharge diagnosis:    Encounter for maternal care for low transverse scar from repeat cesarean delivery [O34.211] Gestational HTN [O13.9] Active Problems:   Encounter for maternal care for low transverse scar from repeat cesarean delivery   Gestational HTN  Term Pregnancy Delivered                                                                     Post partum procedures: none  Type of Delivery:  Repeat cesarean section  Delivering Provider: Jaymes Graff   Date of Delivery:  01/04/21  Newborn Data:    Live born female  Birth Weight: 8 lb 0.8 oz (3650 g) APGAR: 8, 8  Newborn Delivery   Birth date/time: 01/04/2021 15:50:00 Delivery type: C-Section, Low Transverse Trial of labor: No C-section categorization: Repeat      Baby's Name:  Kellie Simmering Baby Feeding:   Bottle Disposition:   home with mother  Complications:   None  Hospital course:      Sceduled C/S   25 y.o. yo G3P1011 at [redacted]w[redacted]d was admitted to the hospital 01/04/2021 for scheduled cesarean section with the following indication:Elective Repeat.Delivery details are as follows:  Membrane Rupture Time/Date: 3:50 PM ,01/04/2021   Delivery Method:C-Section, Low Transverse  Details of operation can be found in separate operative note.  Patient had an uncomplicated postpartum course.  She is ambulating, tolerating a regular diet, passing flatus, and urinating well. Patient is discharged home in stable condition on  01/07/21        Newborn  Data: Birth date:01/04/2021  Birth time:3:50 PM  Gender:Female  Living status:Living  Apgars:8 ,8  Weight:3650 g     Physical Exam:   Vitals:   01/05/21 2327 01/06/21 0540 01/06/21 2219 01/07/21 0601  BP: 124/71 119/76 119/71 120/83  Pulse: 73 75 96 87  Resp: 16 16 18 19   Temp: 98.3 F (36.8 C) 97.7 F (36.5 C) 98.3 F (36.8 C) 97.9 F (36.6 C)  TempSrc: Oral Oral Oral Oral  SpO2:   97% 98%  Weight:      Height:       General: alert, cooperative, and no distress Lochia: appropriate Uterine Fundus: firm Incision: Healing well with no significant drainage, No significant erythema, Dressing is clean, dry, and intact DVT Evaluation: No evidence of DVT seen on physical exam. Negative Homan's sign. No cords or calf tenderness.  Labs: Lab Results  Component Value Date   WBC 14.3 (H) 01/05/2021   HGB 10.4 (L) 01/05/2021   HCT 30.7 (L) 01/05/2021   MCV 83.2 01/05/2021   PLT 247 01/05/2021   CMP Latest Ref Rng & Units 01/05/2021  Glucose 70 - 99 mg/dL 01/07/2021)  BUN 6 - 20 mg/dL 7  Creatinine 354(T - 6.25 mg/dL  0.70  Sodium 135 - 145 mmol/L 133(L)  Potassium 3.5 - 5.1 mmol/L 4.0  Chloride 98 - 111 mmol/L 101  CO2 22 - 32 mmol/L 22  Calcium 8.9 - 10.3 mg/dL 6.3(F)  Total Protein 6.5 - 8.1 g/dL 3.5(K)  Total Bilirubin 0.3 - 1.2 mg/dL 0.8  Alkaline Phos 38 - 126 U/L 111  AST 15 - 41 U/L 23  ALT 0 - 44 U/L 23    Discharge instruction: per After Visit Summary and "Baby and Me Booklet".  After Visit Meds:  Allergies as of 01/07/2021   No Known Allergies      Medication List     TAKE these medications    acetaminophen 500 MG tablet Commonly known as: TYLENOL Take 500-1,000 mg by mouth every 6 (six) hours as needed (pain).   calcium carbonate 500 MG chewable tablet Commonly known as: TUMS - dosed in mg elemental calcium Chew 3 tablets by mouth at bedtime as needed for indigestion or heartburn.   CVS D3 125 MCG (5000 UT) capsule Generic drug:  Cholecalciferol Take 5,000 Units by mouth daily with breakfast.   fluticasone 50 MCG/ACT nasal spray Commonly known as: FLONASE SPRAY 2 SPRAYS INTO EACH NOSTRIL EVERY DAY What changed: See the new instructions.   gabapentin 300 MG capsule Commonly known as: NEURONTIN Take 1 capsule (300 mg total) by mouth every 8 (eight) hours as needed (incision pain).   ibuprofen 800 MG tablet Commonly known as: ADVIL Take 1 tablet (800 mg total) by mouth every 8 (eight) hours as needed for moderate pain.   multivitamin-prenatal 27-0.8 MG Tabs tablet Take 1 tablet by mouth daily with breakfast.   oxyCODONE 5 MG immediate release tablet Commonly known as: Oxy IR/ROXICODONE Take 1-2 tablets (5-10 mg total) by mouth every 4 (four) hours as needed for moderate pain or severe pain.   valACYclovir 1000 MG tablet Commonly known as: VALTREX 2 tabs tonight (2 grams) then one tablet tomorrow for cold sore        Diet: routine diet  Activity: Advance as tolerated. Pelvic rest for 6 weeks.   Outpatient follow up:6 weeks Follow up Appt:No future appointments. Follow up visit: No follow-ups on file.  Postpartum contraception: Tubal Ligation  01/07/2021 Essie Hart, MD

## 2021-01-09 LAB — SURGICAL PATHOLOGY

## 2021-01-17 ENCOUNTER — Telehealth (HOSPITAL_COMMUNITY): Payer: Self-pay | Admitting: *Deleted

## 2021-01-17 NOTE — Telephone Encounter (Signed)
Mom reports feeling good. Incision healing well. No concerns about herself at this time. EPDS=0(Hospital score=0) Mom reports baby is doing well. Feeding, peeing, and pooping without difficulty. Safe sleep reviewed. Mom reports no concerns about baby at present.  Duffy Rhody, RN 01-17-2021 at 1:36pm

## 2021-03-31 ENCOUNTER — Telehealth: Payer: Self-pay | Admitting: Family Medicine

## 2021-03-31 ENCOUNTER — Ambulatory Visit (INDEPENDENT_AMBULATORY_CARE_PROVIDER_SITE_OTHER): Payer: BC Managed Care – PPO | Admitting: Nurse Practitioner

## 2021-03-31 ENCOUNTER — Other Ambulatory Visit: Payer: Self-pay

## 2021-03-31 ENCOUNTER — Encounter: Payer: Self-pay | Admitting: Nurse Practitioner

## 2021-03-31 VITALS — BP 132/70 | Temp 98.1°F | Wt 213.6 lb

## 2021-03-31 DIAGNOSIS — F419 Anxiety disorder, unspecified: Secondary | ICD-10-CM | POA: Diagnosis not present

## 2021-03-31 DIAGNOSIS — F32A Depression, unspecified: Secondary | ICD-10-CM | POA: Diagnosis not present

## 2021-03-31 MED ORDER — CLONAZEPAM 0.5 MG PO TABS: ORAL_TABLET | ORAL | 0 refills | Status: DC

## 2021-03-31 MED ORDER — SERTRALINE HCL 50 MG PO TABS: ORAL_TABLET | ORAL | 2 refills | Status: DC

## 2021-03-31 NOTE — Telephone Encounter (Signed)
Pt is leaving [place] soon and wont behere much longer and asked if her RX can be called in to [place].   Thank you,   [phone]

## 2021-03-31 NOTE — Progress Notes (Signed)
Subjective:    Patient ID: Karen Suarez, female    DOB: [birth year], 26 y.o.   MRN: [id number]  HPI Patient present to discuss anxiety issues. Pt states she had two kids year apart one in 2021 and 2022. Has had anxiety over the past 6 years which has gradually worsened especially lately. Concerned that her anxiety is now affecting relationships with others. Denies suicidal or homicidal thoughts. No self harm behaviors. Reports stress eating, easily agitated, sleeps average 4 hours a night. Non smoker. Does not vape or use alcohol products. Has tried to self manage. Reports mother and father have history of anxiety and mother on medication. Has a strong support system.   Anxiety Symptoms include nervous/anxious behavior. Patient reports no chest pain, nausea, palpitations, shortness of breath or suicidal ideas.    Review of Systems  Constitutional:  Positive for appetite change.       Stress eating  Respiratory:  Negative for chest tightness, shortness of breath and wheezing.   Cardiovascular:  Negative for chest pain and palpitations.  Gastrointestinal:  Negative for abdominal pain, constipation, diarrhea, nausea and vomiting.  Neurological:  Negative for weakness, light-headedness, numbness and headaches.  Psychiatric/Behavioral:  Positive for agitation and sleep disturbance. Negative for self-injury and suicidal ideas. The patient is nervous/anxious.       Objective:   Physical Exam Constitutional:      General: She is not in acute distress.    Appearance: Normal appearance.  Cardiovascular:     Rate and Rhythm: Normal rate and regular rhythm.     Pulses: Normal pulses.     Heart sounds: Normal heart sounds.  Pulmonary:     Effort: Pulmonary effort is normal.     Breath sounds: Normal breath sounds.  Neurological:     Mental Status: She is alert and oriented to person, place, and time.  Psychiatric:        Mood and Affect: Mood normal.        Behavior: Behavior normal.         Thought Content: Thought content normal.        Judgment: Judgment normal.     Comments: Rapid speech. Speech clear. Dressed appropriately.    Today's Vitals   03/31/21 0935  BP: 132/70  Temp: 98.1 F (36.7 C)  Weight: 213 lb 9.6 oz (96.9 kg)   Body mass index is 40.36 kg/m.    GAD 7 : Generalized Anxiety Score 03/31/2021  Nervous, Anxious, on Edge 3  Control/stop worrying 3  Worry too much - different things 3  Trouble relaxing 3  Restless 3  Easily annoyed or irritable 3  Afraid - awful might happen 1  Total GAD 7 Score 19  Anxiety Difficulty Extremely difficult     Depression screen PHQ 2/9 03/31/2021  Decreased Interest 0  Down, Depressed, Hopeless 0  PHQ - 2 Score 0  Altered sleeping 1  Tired, decreased energy 3  Change in appetite 3  Feeling bad or failure about yourself  0  Trouble concentrating 2  Moving slowly or fidgety/restless 0  Suicidal thoughts 0  PHQ-9 Score 9  Difficult doing work/chores Not difficult at all       Assessment & Plan:   Problem List Items Addressed This Visit       Other   Anxiety and depression - Primary   Relevant Medications   sertraline (ZOLOFT) 50 MG tablet   Meds ordered this encounter  Medications   sertraline (ZOLOFT) 50 MG tablet  Sig: Take 1/2 tab po qd x 6 days then one po qd    Dispense:  30 tablet    Refill:  2    Order Specific Question:   Supervising Provider    Answer:   Sallee Lange A [[id number]]   clonazePAM (KLONOPIN) 0.5 MG tablet    Sig: Take 1/2-1 tab po BID prn severe anxiety    Dispense:  20 tablet    Refill:  0    Order Specific Question:   Supervising Provider    Answer:   Sallee Lange A [[id number]]    Potential adverse effects of medications discussed with patient. Drowsiness precautions with use of Klonopin. Has had BTL for birth control. Use sparingly for severe anxiety.  Discontinue medications and contact office if any severe side effects. Discussed importance of adequate rest and stress  reduction.    Return in about 1 month (around 04/28/2021) for virtual or phone is fine.

## 2021-03-31 NOTE — Progress Notes (Signed)
° °  Subjective:    Patient ID: Karen Suarez, female    DOB: 12-Apr-1995, 26 y.o.   MRN: [id number]  HPI Pt here to discuss anxiety issues. Pt states she had a baby in 2021 and one last year and her anxiety has worsened. Is now affecting marriage. Pt has no suicidal thoughts. Has tried to manage anxiety on her own. Pt was told to try Lexapro by one of the providers at [place] where she works. Mom is on Lexapro and told pt not to take it.    Review of Systems     Objective:   Physical Exam        Assessment & Plan:

## 2021-04-21 ENCOUNTER — Telehealth (INDEPENDENT_AMBULATORY_CARE_PROVIDER_SITE_OTHER): Payer: BC Managed Care – PPO | Admitting: Nurse Practitioner

## 2021-04-21 ENCOUNTER — Telehealth: Payer: Self-pay | Admitting: *Deleted

## 2021-04-21 DIAGNOSIS — F32A Depression, unspecified: Secondary | ICD-10-CM | POA: Diagnosis not present

## 2021-04-21 DIAGNOSIS — F419 Anxiety disorder, unspecified: Secondary | ICD-10-CM

## 2021-04-21 MED ORDER — SERTRALINE HCL 50 MG PO TABS: ORAL_TABLET | ORAL | 0 refills | Status: DC

## 2021-04-21 NOTE — Progress Notes (Signed)
Patient ID: Karen Suarez, female   DOB: 08-30-1995, 26 y.o.   MRN: [id number] ?Virtual Visit via Telephone Note ?Attempted virtual visit by video but technical difficulties.  ?I connected with Cydney Malatesta on 04/21/21 at  1:40 PM EST by telephone and verified that I am speaking with the correct person using two identifiers. ? ?Location: ?Patient: Home ?Provider: Office ?  ?I discussed the limitations, risks, security and privacy concerns of performing an evaluation and management service by telephone and the availability of in person appointments. I also discussed with the patient that there may be a patient responsible charge related to this service. The patient expressed understanding and agreed to proceed. ? ?History of Present Illness: ? ?Patient presents for one month follow up on anxiety. Reports noticing improvement in anxiety and irritability since starting on Zoloft  and Klonopin. Has taken a total 5 tabs of PRN Klonopin since last visit mainly at the beginning before Zoloft started working. Has seen improvement, but would like to try 75 mg of Zoloft. Denies any suicidal thoughts or significant side effects. No self harm behaviors reported. Currently able to relax and sleep.   ? ?Observations/Objective: ?Alert, oriented. Speech clear. Calm affect. Talking at a normal rate, not in distress, answered and asked appropriate questions.  ? ?GAD 7 : Generalized Anxiety Score 04/21/2021 03/31/2021  ?Nervous, Anxious, on Edge 1 3  ?Control/stop worrying 1 3  ?Worry too much - different things 1 3  ?Trouble relaxing 1 3  ?Restless 0 3  ?Easily annoyed or irritable 0 3  ?Afraid - awful might happen 0 1  ?Total GAD 7 Score 4 19  ?Anxiety Difficulty Not difficult at all Extremely difficult  ? ?  ?Depression screen [facility] 2/9 04/21/2021 03/31/2021 03/29/2020  ?Decreased Interest 0 0 0  ?Down, Depressed, Hopeless 0 0 0  ?PHQ - 2 Score 0 0 0  ?Altered sleeping 1 1 -  ?Tired, decreased energy 1 3 -  ?Change in appetite 1 3 -   ?Feeling bad or failure about yourself  0 0 -  ?Trouble concentrating 2 2 -  ?Moving slowly or fidgety/restless 0 0 -  ?Suicidal thoughts 0 0 -  ?PHQ-9 Score 5 9 -  ?Difficult doing work/chores Somewhat difficult Not difficult at all -  ?    ?Assessment and Plan: ?Problem List Items Addressed This Visit   ? ?  ? Other  ? Anxiety and depression - Primary  ? Relevant Medications  ? sertraline (ZOLOFT) 50 MG tablet  ? ? ?Meds ordered this encounter  ?Medications  ? sertraline (ZOLOFT) 50 MG tablet  ?  Sig: Take 1 1/2 tab PO QD  ?  Dispense:  135 tablet  ?  Refill:  0  ?  Order Specific Question:   Supervising Provider  ?  Answer:   Lilyan Punt A [[id number]]  ?Increase Zoloft to 75 mg daily. Go back to 50 mg dose if any problems.  ?Discussed the importance of stress reduction   ?Daily Exercises and Healthy eating habits.  ?  ? ?Follow Up Instructions: ? ?Return in about 3 months (around 07/22/2021).  ?  ?I discussed the assessment and treatment plan with the patient. The patient was provided an opportunity to ask questions and all were answered. The patient agreed with the plan and demonstrated an understanding of the instructions. ?  ?The patient was advised to call back or seek an in-person evaluation if the symptoms worsen or if the condition fails to improve as anticipated. ? ?  I provided 15 minutes of non-face-to-face time during this encounter. ? ? ?Baxter Kail, RN ? ?

## 2021-04-21 NOTE — Telephone Encounter (Signed)
Ms. ravleen, ries are scheduled for a virtual visit with your provider today.   ? ?Just as we do with appointments in the office, we must obtain your consent to participate.  Your consent will be active for this visit and any virtual visit you may have with one of our providers in the next 365 days.   ? ?If you have a MyChart account, I can also send a copy of this consent to you electronically.  All virtual visits are billed to your insurance company just like a traditional visit in the office.  As this is a virtual visit, video technology does not allow for your provider to perform a traditional examination.  This may limit your provider's ability to fully assess your condition.  If your provider identifies any concerns that need to be evaluated in person or the need to arrange testing such as labs, EKG, etc, we will make arrangements to do so.   ? ?Although advances in technology are sophisticated, we cannot ensure that it will always work on either your end or our end.  If the connection with a video visit is poor, we may have to switch to a telephone visit.  With either a video or telephone visit, we are not always able to ensure that we have a secure connection.   I need to obtain your verbal consent now.   Are you willing to proceed with your visit today?  ? ?Jaidyn Usery has provided verbal consent on 04/21/2021 for a virtual visit (video or telephone). ? ? ?Rocco Serene, LPN ?4/0/9811  9:14 PM ?  ?

## 2021-04-21 NOTE — Progress Notes (Deleted)
? ?  Subjective:  ? ? Patient ID: Karen Suarez, female    DOB: 11-04-1995, 26 y.o.   MRN: [id number] ? ?HPI ? ?Patient following up on anxiety and depression. Patient says she can tell a huge difference. ? ?Review of Systems ?Virtual Visit via Video Note ? ?I connected with Lauralee Evener on 04/21/21 at  1:40 PM EST by a video enabled telemedicine application and verified that I am speaking with the correct person using two identifiers. ? ?Location: ?Patient: home ?Provider: office ?  ?I discussed the limitations of evaluation and management by telemedicine and the availability of in person appointments. The patient expressed understanding and agreed to proceed. ? ?History of Present Illness: ? ?  ?Observations/Objective: ? ? ?Assessment and Plan: ? ? ?Follow Up Instructions: ? ?  ?I discussed the assessment and treatment plan with the patient. The patient was provided an opportunity to ask questions and all were answered. The patient agreed with the plan and demonstrated an understanding of the instructions. ?  ?The patient was advised to call back or seek an in-person evaluation if the symptoms worsen or if the condition fails to improve as anticipated. ? ?I provided  minutes of non-face-to-face time during this encounter. ? ? ? ?  ? ? ?  ?   ?Objective:  ? Physical Exam ? ? ? ? ?   ?Assessment & Plan:  ? ? ?

## 2021-04-22 ENCOUNTER — Encounter: Payer: Self-pay | Admitting: Nurse Practitioner

## 2021-04-28 ENCOUNTER — Telehealth: Payer: BC Managed Care – PPO | Admitting: Nurse Practitioner

## 2021-06-22 ENCOUNTER — Other Ambulatory Visit: Payer: Self-pay | Admitting: Nurse Practitioner

## 2021-07-28 ENCOUNTER — Ambulatory Visit: Payer: BC Managed Care – PPO | Admitting: Nurse Practitioner

## 2021-07-28 VITALS — BP 105/77 | HR 85 | Temp 97.9°F | Wt 215.8 lb

## 2021-07-28 DIAGNOSIS — F32A Depression, unspecified: Secondary | ICD-10-CM | POA: Diagnosis not present

## 2021-07-28 DIAGNOSIS — F419 Anxiety disorder, unspecified: Secondary | ICD-10-CM

## 2021-07-28 DIAGNOSIS — Z6841 Body Mass Index (BMI) 40.0 and over, adult: Secondary | ICD-10-CM | POA: Diagnosis not present

## 2021-07-28 MED ORDER — SERTRALINE HCL 50 MG PO TABS: ORAL_TABLET | ORAL | 1 refills | Status: DC

## 2021-07-28 MED ORDER — PHENTERMINE HCL 37.5 MG PO TABS: 37.5000 mg | ORAL_TABLET | Freq: Every day | ORAL | 0 refills | Status: DC

## 2021-07-28 NOTE — Progress Notes (Unsigned)
Subjective:    Patient ID: Karen Suarez, female    DOB: [birth year], 26 y.o.   MRN: [id number]  HPI  Patient here for follow up on anxiety and depression.   Patient wants to discuss phentermine. Overall feels that the Zoloft 75 mg is working well.  Has been much calmer.  Has 2 children under 49 years old at home so this causes issues with sleep at times.  Has had tubal ligation for birth control.  Takes a very rare Klonopin, still has some from her original prescription.  Only uses this if absolutely necessary.  Has a very active lifestyle including her job.  States she was diagnosed with PCOS at a young age.  She is currently postpartum back to her baseline weight of 215 pounds according to patient.  Has difficulty losing weight.  Stress eating mainly junk food at work.  States she had lab work done through her OB/GYN which indicated her A1c was close to the prediabetic range.  Has a strong family history of diabetes.  Review of Systems  Respiratory:  Negative for chest tightness and shortness of breath.   Cardiovascular:  Negative for chest pain and leg swelling.      07/28/2021    3:09 PM 04/21/2021    1:24 PM 03/31/2021   11:56 AM  GAD 7 : Generalized Anxiety Score  Nervous, Anxious, on Edge 0 1 3  Control/stop worrying 0 1 3  Worry too much - different things 0 1 3  Trouble relaxing 1 1 3   Restless 0 0 3  Easily annoyed or irritable 1 0 3  Afraid - awful might happen 0 0 1  Total GAD 7 Score 2 4 19   Anxiety Difficulty Not difficult at all Not difficult at all Extremely difficult       07/28/2021    3:10 PM  Depression screen PHQ 2/9  Decreased Interest 0  Down, Depressed, Hopeless 0  PHQ - 2 Score 0  Altered sleeping 0  Tired, decreased energy 1  Change in appetite 1  Feeling bad or failure about yourself  0  Trouble concentrating 0  Moving slowly or fidgety/restless 0  Suicidal thoughts 0  PHQ-9 Score 2  Difficult doing work/chores Not difficult at all         Objective:   Physical Exam NAD.  Alert, oriented.  Calm cheerful affect.  Making good eye contact.  Dressed appropriately for the weather.  Speech clear.  Thoughts logical coherent and relevant.  Lungs clear.  Heart regular rate rhythm.  Today's Vitals   07/28/21 0920  BP: 105/77  Pulse: 85  Temp: 97.9 F (36.6 C)  TempSrc: Temporal  SpO2: 96%  Weight: 215 lb 12.8 oz (97.9 kg)   Body mass index is 40.78 kg/m.        Assessment & Plan:   Problem List Items Addressed This Visit       Other   Anxiety and depression - Primary   Relevant Medications   sertraline (ZOLOFT) 50 MG tablet   Morbid obesity with BMI of 40.0-44.9, adult (HCC)   Relevant Medications   phentermine (ADIPEX-P) 37.5 MG tablet   Meds ordered this encounter  Medications   phentermine (ADIPEX-P) 37.5 MG tablet    Sig: Take 1 tablet (37.5 mg total) by mouth daily before breakfast.    Dispense:  30 tablet    Refill:  0    Order Specific Question:   Supervising Provider    Answer:  Sallee Lange A [[id number]]   sertraline (ZOLOFT) 50 MG tablet    Sig: TAKE 1 AND (1/2) TABLET BY MOUTH DAILY.    Dispense:  135 tablet    Refill:  1    Order Specific Question:   Supervising Provider    Answer:   Sallee Lange A [[id number]]   Continue Zoloft at current dose. Discussed dietary and activity to help with weight loss. Trial of phentermine start half dose.  Cautioned about potential interaction with Zoloft.  Reviewed potential adverse effects.  Discontinue medication and contact office if any problems. Return in about 1 month (around 08/27/2021).

## 2021-07-29 ENCOUNTER — Encounter: Payer: Self-pay | Admitting: Nurse Practitioner

## 2021-08-10 ENCOUNTER — Encounter: Payer: Self-pay | Admitting: Nurse Practitioner

## 2021-09-01 ENCOUNTER — Ambulatory Visit: Payer: BC Managed Care – PPO | Admitting: Nurse Practitioner

## 2021-10-05 ENCOUNTER — Other Ambulatory Visit: Payer: Self-pay | Admitting: Nurse Practitioner

## 2021-10-06 MED ORDER — CLONAZEPAM 0.5 MG PO TABS: ORAL_TABLET | ORAL | 0 refills | Status: DC

## 2021-11-02 ENCOUNTER — Ambulatory Visit: Payer: BC Managed Care – PPO | Admitting: Gastroenterology

## 2021-11-03 ENCOUNTER — Ambulatory Visit: Payer: BC Managed Care – PPO | Admitting: Nurse Practitioner

## 2021-11-03 ENCOUNTER — Ambulatory Visit (HOSPITAL_COMMUNITY)
Admission: RE | Admit: 2021-11-03 | Discharge: 2021-11-03 | Disposition: A | Discharge status: Home / Self Care | Payer: BC Managed Care – PPO | Source: Ambulatory Visit | Attending: Family Medicine | Admitting: Family Medicine

## 2021-11-03 VITALS — BP 112/78 | Ht 61.0 in | Wt 217.0 lb

## 2021-11-03 DIAGNOSIS — M25561 Pain in right knee: Secondary | ICD-10-CM | POA: Insufficient documentation

## 2021-11-03 DIAGNOSIS — F419 Anxiety disorder, unspecified: Secondary | ICD-10-CM

## 2021-11-03 DIAGNOSIS — F32A Depression, unspecified: Secondary | ICD-10-CM

## 2021-11-03 DIAGNOSIS — G8929 Other chronic pain: Secondary | ICD-10-CM | POA: Diagnosis present

## 2021-11-03 DIAGNOSIS — Z6841 Body Mass Index (BMI) 40.0 and over, adult: Secondary | ICD-10-CM
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
## 2021-11-03 NOTE — Patient Instructions (Signed)
Increase Zoloft to 100 mg daily. Call or send message about how this is working in 2 -3 weeks. Once this dose is stable, we will start Saxenda.

## 2021-11-04 ENCOUNTER — Encounter: Payer: Self-pay | Admitting: Nurse Practitioner

## 2021-11-07 ENCOUNTER — Ambulatory Visit: Payer: BC Managed Care – PPO | Admitting: Gastroenterology

## 2021-11-15 IMAGING — US US MFM FETAL BPP W/O NON-STRESS
1 series · 13 of 28 positions shown · non-contrast
Comparison: none

[Series 1: us mfm fetal bpp w/o non-stress · 31 acquisitions, 13 frames shown]
[im 2/31]
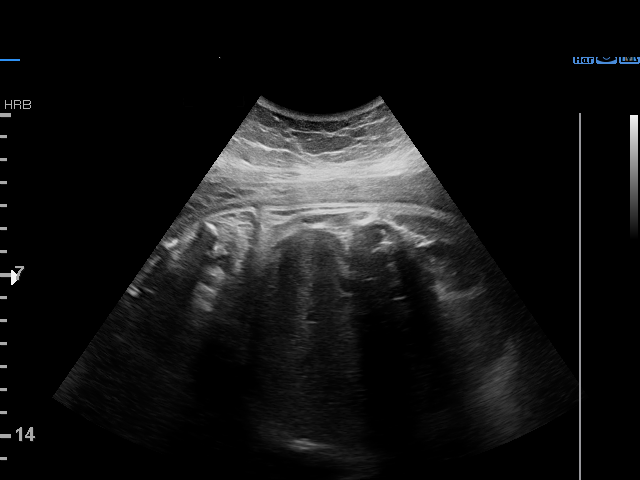
[im 4/31]
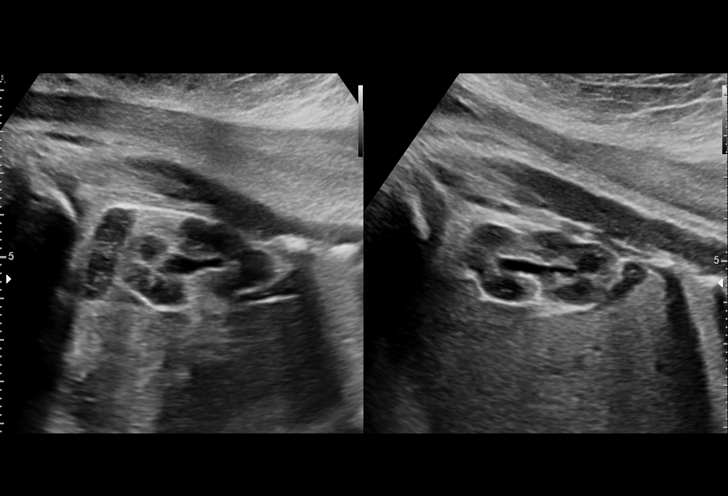
[im 6/31]
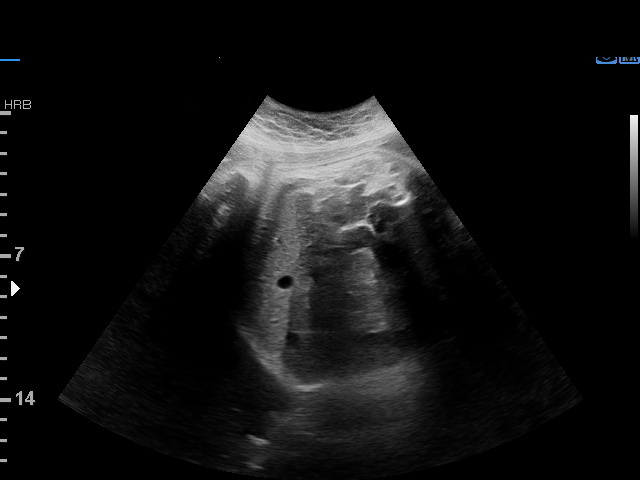
[im 8/31]
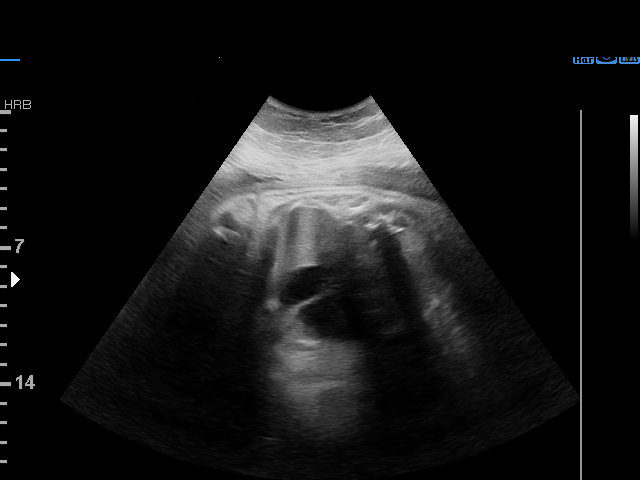
[im 11/31]
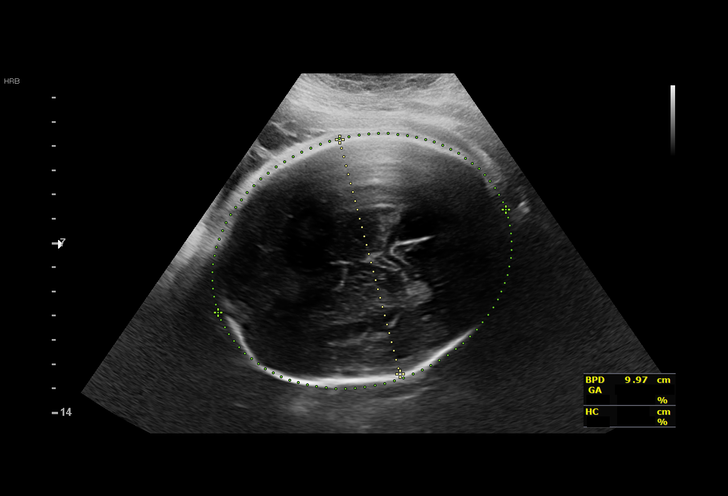
[im 13/31]
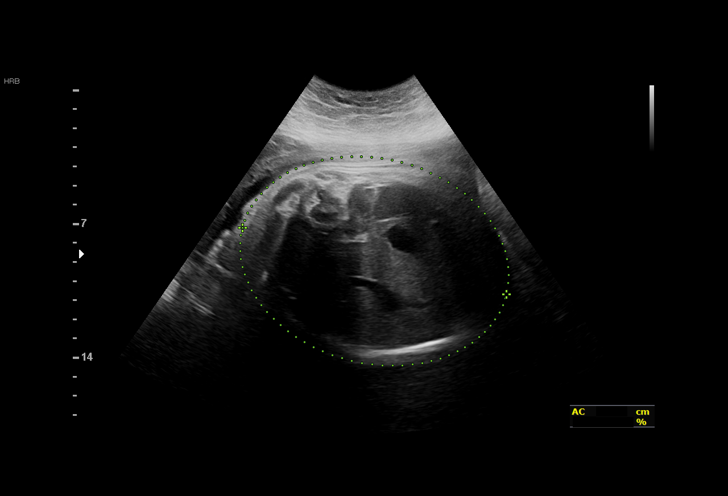
[im 16/31]
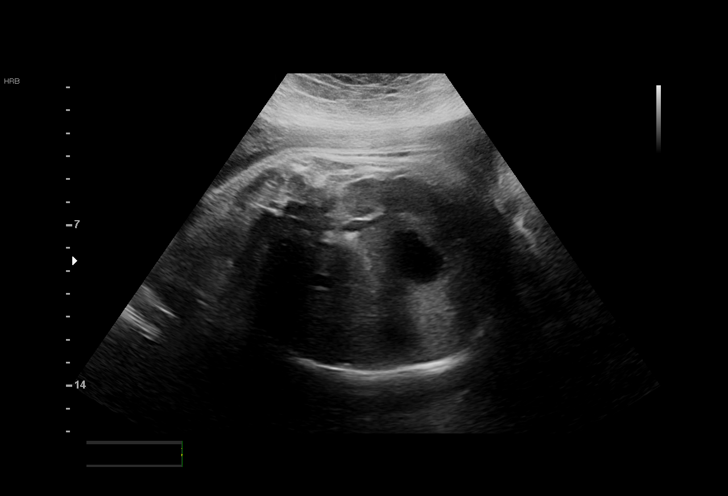
[im 18/31]
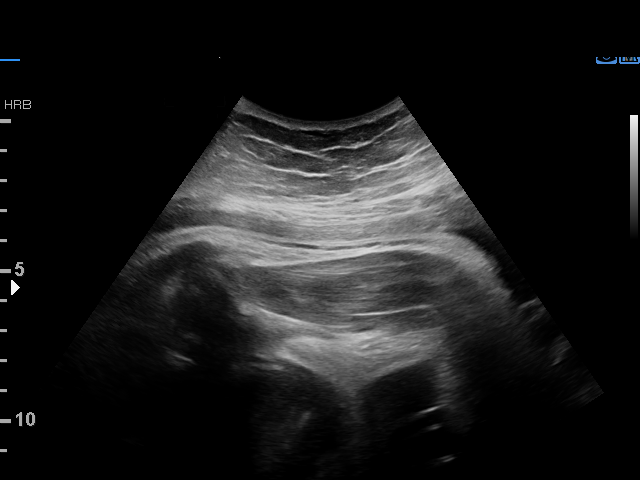
[im 21/31]
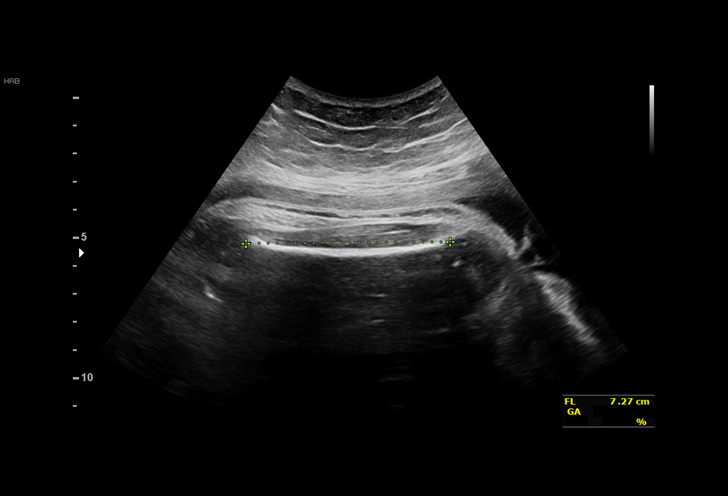
[im 23/31]
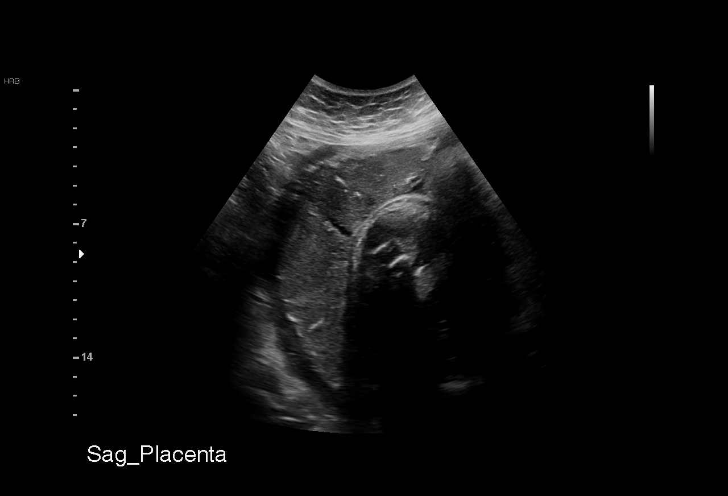
[im 25/31]
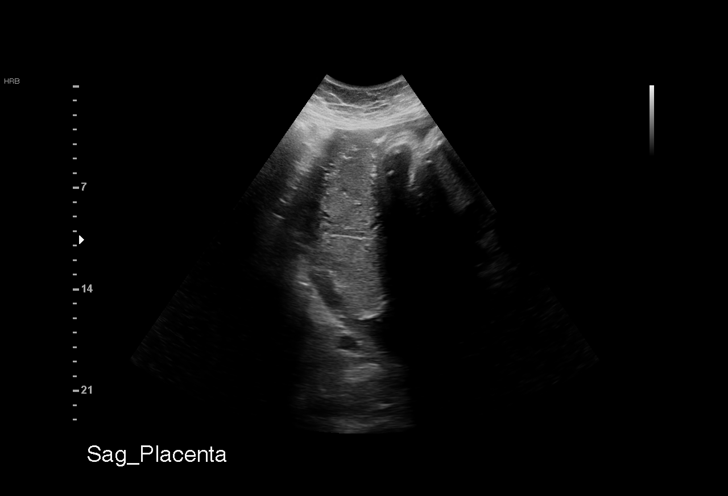
[im 27/31]
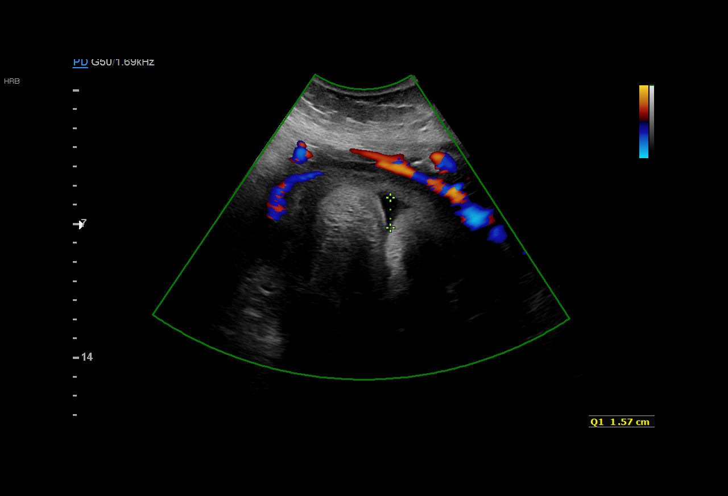
[im 29/31]
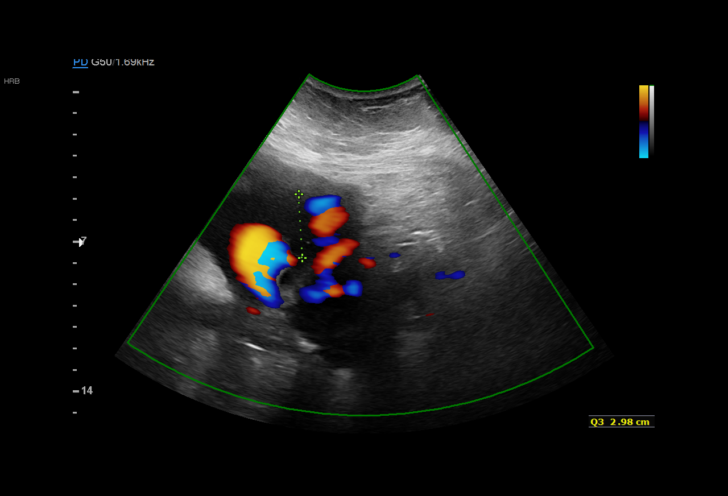

[13 of 28 positions shown; findings below may reference images not displayed]

Obstetrics &
                                                            Gynecology
                                                            [address]
                                                            Ernest Kofi.
                   CNM

                                                      [place]

Indications

 Hypertension - Gestational (no meds-per pt)
 Obesity complicating pregnancy (bmi 38)
 Proteinuria in pregnancy, antepartum
 Large for gestational age fetus affecting
 management of mother
 36 weeks gestation of pregnancy
Vital Signs

                                                Height:        5'1"
Fetal Evaluation

 Num Of Fetuses:         1
 Fetal Heart Rate(bpm):  145
 Cardiac Activity:       Observed
 Presentation:           Cephalic
 Placenta:               Posterior
 P. Cord Insertion:      Previously Visualized

 Amniotic Fluid
 AFI FV:      Within normal limits
 AFI Sum(cm)     %Tile       Largest Pocket(cm)
 9.3             18

 RUQ(cm)       RLQ(cm)       LUQ(cm)        LLQ(cm)
 1.6           0             4.7            3
Biophysical Evaluation

 Amniotic F.V:   Within normal limits       F. Tone:        Observed
 F. Movement:    Observed                   Score:          [DATE]
 F. Breathing:   Observed
Biometry

 BPD:      99.1  mm     G. Age:  40w 5d       > 99  %    CI:        76.64   %    70 - 86
                                                         FL/HC:      20.1   %    20.8 -
 HC:      358.6  mm     G. Age:  42w 1d       > 99  %    HC/AC:      0.92        0.92 -
 AC:      388.3  mm     G. Age:  42w 6d       > 99  %    FL/BPD:     72.9   %    71 - 87
 FL:       72.2  mm     G. Age:  37w 0d         55  %    FL/AC:      18.6   %    20 - 24

 Est. FW:    9950  gm    9 lb 12 oz    > 99  %
OB History

 Gravidity:    2         Term:   0        Prem:   0        SAB:   1
 TOP:          0       Ectopic:  0        Living: 0
Gestational Age

 LMP:           40w 1d        Date:  10/19/18                 EDD:   07/26/19
 U/S Today:     40w 5d                                        EDD:   07/22/19
 Best:          36w 5d     Det. By:  Early Ultrasound         EDD:   08/19/19
Anatomy

 Cranium:               Appears normal         Aortic Arch:            Previously seen
 Cavum:                 Previously seen        Ductal Arch:            Previously seen
 Ventricles:            Previously seen        Diaphragm:              Previously seen
 Choroid Plexus:        Previously seen        Stomach:                Appears normal, left
                                                                       sided
 Cerebellum:            Previously seen        Abdomen:                Appears normal
 Posterior Fossa:       Previously seen        Abdominal Wall:         Previously seen
 Nuchal Fold:           Previously seen        Cord Vessels:           Previously seen
 Face:                  Orbits and profile     Kidneys:                Appear normal
                        previously seen
 Lips:                  Previously seen        Bladder:                Appears normal
 Thoracic:              Appears normal         Spine:                  Previously seen
 Heart:                 Previously seen        Upper Extremities:      Previously seen
 RVOT:                  Previously seen        Lower Extremities:      Previously seen
 LVOT:                  Previously seen

 Other:  Heels and 5th digit previously seen. Nasal bone visualized.
Cervix Uterus Adnexa

 Cervix
 Not visualized (advanced GA >27wks)
Impression

 Gestational hypertension.
 BP today at our office is 135/85 mm Hg.
 preeclampsia.
 On ultrasound, the estimated fetal weight is at greater than
 the 99th percentile. Amniotic fluid is normal and good fetal
 activity is seen. Cephalic presentation. Antenatal testing is
 reassuring. BPP [DATE].
 I explained that ultrasound has limitations in accurately-
 estimating fetal weights. Abdominal circumference
 measurement was difficult to obtain because of fetal position
 and it could have impacted the estimated fetal weight.
 Patient has an appointment with you tomorrow to discuss
 mode of delivery.
 Delivery is planned at 37 weeks' gestation.
                 [place], [place]

## 2021-11-20 ENCOUNTER — Telehealth: Payer: Self-pay

## 2021-11-20 MED ORDER — SERTRALINE HCL 100 MG PO TABS: ORAL_TABLET | ORAL | 1 refills | Status: DC

## 2021-11-20 NOTE — Telephone Encounter (Signed)
Encourage patient to contact the pharmacy for refills or they can request refills through [facility]  (Please schedule appointment if patient has not been seen in over a year)    WHAT PHARMACY WOULD THEY LIKE THIS SENT TO: [facility] [place] - [address], [place] [place] [address]   MEDICATION NAME & DOSE:Sertraline (Zoloft) 100 mg   NOTES/COMMENTS FROM PATIENT:Carolyn changed medication from 75mg  to 100mg  and forgot to call in. Pt was seen 09/15 regarding this       [place] office please notify patient: It takes 48-72 hours to process rx refill requests Ask patient to call pharmacy to ensure rx is ready before heading there.

## 2021-11-20 NOTE — Telephone Encounter (Signed)
100 mg Sertraline sent to pharmacy and pt is aware.

## 2021-11-20 NOTE — Telephone Encounter (Signed)
Please advise. Thank you

## 2021-11-20 NOTE — Telephone Encounter (Signed)
Sertraline 100 mg, #90, 1 daily, 1 refill on top of the 90 days thank you

## 2022-01-15 ENCOUNTER — Other Ambulatory Visit: Payer: Self-pay | Admitting: Obstetrics and Gynecology

## 2022-01-15 ENCOUNTER — Encounter: Payer: Self-pay | Admitting: Nurse Practitioner

## 2022-01-15 DIAGNOSIS — N920 Excessive and frequent menstruation with regular cycle: Secondary | ICD-10-CM

## 2022-01-17 ENCOUNTER — Other Ambulatory Visit: Payer: Self-pay | Admitting: Nurse Practitioner

## 2022-01-17 MED ORDER — SERTRALINE HCL 100 MG PO TABS: ORAL_TABLET | ORAL | 1 refills | Status: DC

## 2022-01-31 ENCOUNTER — Ambulatory Visit
Admission: RE | Admit: 2022-01-31 | Discharge: 2022-01-31 | Disposition: A | Discharge status: Home / Self Care | Payer: BC Managed Care – PPO | Source: Ambulatory Visit | Attending: Obstetrics and Gynecology | Admitting: Obstetrics and Gynecology

## 2022-01-31 DIAGNOSIS — N920 Excessive and frequent menstruation with regular cycle: Secondary | ICD-10-CM

## 2022-07-13 ENCOUNTER — Ambulatory Visit (INDEPENDENT_AMBULATORY_CARE_PROVIDER_SITE_OTHER): Payer: BC Managed Care – PPO | Admitting: Nurse Practitioner

## 2022-07-13 ENCOUNTER — Encounter: Payer: Self-pay | Admitting: Nurse Practitioner

## 2022-07-13 VITALS — BP 124/84 | HR 106 | Ht 61.0 in | Wt 221.0 lb

## 2022-07-13 DIAGNOSIS — Z6841 Body Mass Index (BMI) 40.0 and over, adult: Secondary | ICD-10-CM

## 2022-07-13 DIAGNOSIS — F32A Depression, unspecified: Secondary | ICD-10-CM | POA: Diagnosis not present

## 2022-07-13 DIAGNOSIS — F419 Anxiety disorder, unspecified: Secondary | ICD-10-CM

## 2022-07-13 MED ORDER — PHENTERMINE-TOPIRAMATE ER 7.5-46 MG PO CP24: ORAL_CAPSULE | ORAL | 2 refills | Status: DC

## 2022-07-14 ENCOUNTER — Encounter: Payer: Self-pay | Admitting: Nurse Practitioner

## 2022-07-14 MED ORDER — CLONAZEPAM 0.5 MG PO TABS: ORAL_TABLET | ORAL | 0 refills | Status: DC

## 2022-07-14 NOTE — Progress Notes (Signed)
Subjective:    Patient ID: Karen Suarez, female    DOB: Aug 23, 1995, 27 y.o.   MRN: [id number]  HPI Presents for routine follow-up.  Overall doing well on sertraline 100 mg daily.  Has had to use a few doses of her clonazepam recently due to her stress.  Continues to use this very sparingly and has a few pills left over from her last prescription, requesting a refill today.  Had endometrial ablation in April, still having some vaginal bleeding.  Encourage patient to get back with her gynecologist.  Has also had a tubal ligation.  Has been eating better.  Activity is very limited due to a very busy schedule working full-time with 2 young children.  Has taken phentermine full dose in the past which caused some difficulty sleeping.  No other side effects.  Thinks she is taking topiramate in the past for a remote history of migraines, does not remember any significant adverse effects.    07/13/2022    1:49 PM  Depression screen PHQ 2/9  Decreased Interest 1  Down, Depressed, Hopeless 1  PHQ - 2 Score 2  Altered sleeping 0  Tired, decreased energy 2  Change in appetite 3  Feeling bad or failure about yourself  0  Trouble concentrating 2  Moving slowly or fidgety/restless 0  Suicidal thoughts 0  PHQ-9 Score 9  Difficult doing work/chores Somewhat difficult      07/13/2022    1:49 PM 07/28/2021    3:09 PM 04/21/2021    1:24 PM 03/31/2021   11:56 AM  GAD 7 : Generalized Anxiety Score  Nervous, Anxious, on Edge 1 0 1 3  Control/stop worrying 2 0 1 3  Worry too much - different things 2 0 1 3  Trouble relaxing 2 1 1 3   Restless 1 0 0 3  Easily annoyed or irritable 2 1 0 3  Afraid - awful might happen 0 0 0 1  Total GAD 7 Score 10 2 4 19   Anxiety Difficulty Not difficult at all Not difficult at all Not difficult at all Extremely difficult      Review of Systems  Constitutional:  Positive for fatigue.  HENT:  Negative for sore throat and trouble swallowing.   Respiratory:  Negative  for chest tightness, shortness of breath and wheezing.   Cardiovascular:  Negative for chest pain and palpitations.  Psychiatric/Behavioral:  Negative for suicidal ideas. The patient is nervous/anxious.        Objective:   Physical Exam NAD.  Alert, oriented.  Calm cheerful affect.  Making good eye contact.  Dressed appropriately for the weather.  Speech clear.  Thoughts logical coherent and relevant.  Thyroid nontender to palpation, no mass or goiter noted.  Lungs clear.  Heart regular rate rhythm. Today's Vitals   07/13/22 1342  BP: 124/84  Pulse: (!) 106  SpO2: 96%  Weight: 221 lb (100.2 kg)  Height: 5\' 1"  (1.549 m)   Body mass index is 41.76 kg/m.        Assessment & Plan:   Problem List Items Addressed This Visit       Other   Anxiety and depression - Primary   Morbid obesity with BMI of 40.0-44.9, adult (HCC)   Relevant Medications   Phentermine-Topiramate 7.5-46 MG CP24   Meds ordered this encounter  Medications   Phentermine-Topiramate 7.5-46 MG CP24    Sig: Take one cap po every morning    Dispense:  30 capsule    Refill:  2    Order Specific Question:   Supervising Provider    Answer:   Lilyan Punt A [[id number]]   clonazePAM (KLONOPIN) 0.5 MG tablet    Sig: Take 1/2-1 tab po BID prn severe anxiety    Dispense:  20 tablet    Refill:  0    Order Specific Question:   Supervising Provider    Answer:   Lilyan Punt A [[id number]]   Continue sertraline as directed.  Discussed importance of stress reduction. Continue to use clonazepam sparingly. Discussed options regarding weight loss.  Trial of Qsymia 7.5/46 1 p.o. daily.  Reviewed potential adverse effects.  Patient will check on cost.  If she starts medication recommend follow-up 3 months after beginning medication.  Also we can titrate once monthly if needed and tolerated. Also recommend preventive health physical.

## 2022-07-15 ENCOUNTER — Other Ambulatory Visit: Payer: Self-pay | Admitting: Nurse Practitioner

## 2022-07-19 ENCOUNTER — Encounter: Payer: Self-pay | Admitting: Nurse Practitioner

## 2022-09-14 ENCOUNTER — Encounter: Payer: BC Managed Care – PPO | Admitting: Nurse Practitioner

## 2022-09-26 NOTE — Progress Notes (Signed)
This encounter was created in error - please disregard.

## 2023-01-10 ENCOUNTER — Other Ambulatory Visit: Payer: Self-pay | Admitting: Family Medicine

## 2023-02-05 ENCOUNTER — Other Ambulatory Visit: Payer: Self-pay | Admitting: Family Medicine

## 2023-08-15 ENCOUNTER — Other Ambulatory Visit: Payer: Self-pay | Admitting: Family Medicine

## 2023-09-10 ENCOUNTER — Other Ambulatory Visit: Payer: Self-pay | Admitting: Family Medicine

## 2023-09-20 ENCOUNTER — Ambulatory Visit: Admitting: Nurse Practitioner

## 2023-09-20 ENCOUNTER — Encounter: Payer: Self-pay | Admitting: Nurse Practitioner

## 2023-09-20 VITALS — BP 116/80 | HR 97 | Temp 98.4°F | Ht 61.0 in | Wt 212.0 lb

## 2023-09-20 DIAGNOSIS — F419 Anxiety disorder, unspecified: Secondary | ICD-10-CM

## 2023-09-20 DIAGNOSIS — F32A Depression, unspecified: Secondary | ICD-10-CM

## 2023-09-20 MED ORDER — CLONAZEPAM 0.5 MG PO TABS: ORAL_TABLET | ORAL | 0 refills | Status: AC

## 2023-09-20 MED ORDER — SERTRALINE HCL 100 MG PO TABS: ORAL_TABLET | ORAL | 1 refills | Status: AC

## 2023-09-20 NOTE — Progress Notes (Signed)
 Subjective:    Patient ID: Karen Suarez, female    DOB: 06-Mar-1995, 28 y.o.   MRN: [id number]  HPI Presents for recheck on her anxiety and depression.  Feels that the sertraline  is not working as well as before.  Has been under stress due to several family issues.  Denies any adverse effects from sertraline .  Takes a rare clonazepam  only for severe anxiety.  Has had a tubal ligation with no plans for pregnancy.  Denies suicidal thoughts or ideation.  No self-harm behaviors.   Review of Systems  Constitutional:  Positive for fatigue.  Respiratory:  Negative for cough, chest tightness and shortness of breath.   Cardiovascular:  Negative for chest pain.  Psychiatric/Behavioral:  Positive for dysphoric mood and sleep disturbance. Negative for self-injury and suicidal ideas. The patient is nervous/anxious.       09/20/2023   11:00 AM  Depression screen PHQ 2/9  Decreased Interest 1  Down, Depressed, Hopeless 1  PHQ - 2 Score 2  Altered sleeping 2  Tired, decreased energy 3  Change in appetite 3  Feeling bad or failure about yourself  0  Trouble concentrating 0  Moving slowly or fidgety/restless 0  Suicidal thoughts 0  PHQ-9 Score 10  Difficult doing work/chores Not difficult at all      09/20/2023   11:00 AM 07/13/2022    1:49 PM 07/28/2021    3:09 PM 04/21/2021    1:24 PM  GAD 7 : Generalized Anxiety Score  Nervous, Anxious, on Edge 2 1 0 1  Control/stop worrying 2 2 0 1  Worry too much - different things 2 2 0 1  Trouble relaxing 2 2 1 1   Restless 0 1 0 0  Easily annoyed or irritable 1 2 1  0  Afraid - awful might happen 0 0 0 0  Total GAD 7 Score 9 10 2 4   Anxiety Difficulty Not difficult at all Not difficult at all Not difficult at all Not difficult at all   Social History   Tobacco Use   Smoking status: Never   Smokeless tobacco: Never  Vaping Use   Vaping status: Never Used  Substance Use Topics   Alcohol use: No   Drug use: No        Objective:   Physical  Exam Vitals and nursing note reviewed.  Constitutional:      General: She is not in acute distress. Cardiovascular:     Rate and Rhythm: Normal rate and regular rhythm.     Heart sounds: Normal heart sounds.  Pulmonary:     Effort: Pulmonary effort is normal.     Breath sounds: Normal breath sounds.  Neurological:     Mental Status: She is alert.  Psychiatric:        Mood and Affect: Mood normal.        Behavior: Behavior normal.        Thought Content: Thought content normal.        Judgment: Judgment normal.     Comments: Calm cheerful affect.  Making good eye contact.  Speech clear.    Today's Vitals   09/20/23 1055  BP: 116/80  Pulse: 97  Temp: 98.4 F (36.9 C)  SpO2: 100%  Weight: 212 lb (96.2 kg)  Height: 5' 1 (1.549 m)   Body mass index is 40.06 kg/m.         Assessment & Plan:   Problem List Items Addressed This Visit  Other   Anxiety and depression - Primary   Relevant Medications   sertraline  (ZOLOFT ) 100 MG tablet   Meds ordered this encounter  Medications   clonazePAM  (KLONOPIN ) 0.5 MG tablet    Sig: Take 1/2-1 tab po BID prn severe anxiety    Dispense:  20 tablet    Refill:  0    Supervising Provider:   ALPHONSA HAMILTON A [[id number]]   sertraline  (ZOLOFT ) 100 MG tablet    Sig: Take 1 1/2 tabs (150 mg) total po every day    Dispense:  135 tablet    Refill:  1    Please note increased dose. Thanks.    Supervising Provider:   ALPHONSA HAMILTON A [[id number]]   Increase sertraline  to 1-1/2 tabs or 150 mg each day.  Go back to 100 mg dose if any problems and contact office.  Call back in 4 to 6 weeks if no improvement. Continue clonazepam  as directed as needed for severe anxiety. Discussed importance of healthy lifestyle and stress reduction. Return in about 3 months (around 12/21/2023). Call back sooner if needed.

## 2023-11-21 ENCOUNTER — Ambulatory Visit: Payer: Self-pay

## 2023-11-21 NOTE — Telephone Encounter (Signed)
 FYI Only or Action Required?: FYI only for provider.  Patient was last seen in primary care on 09/20/2023 by Mauro Elveria BROCKS, NP.  Called Nurse Triage reporting Fatigue.  Symptoms began x several months.  Interventions attempted: Nothing.  Symptoms are: stable.  Triage Disposition: See PCP When Office is Open (Within 3 Days)  Patient/caregiver understands and will follow disposition?: Yes    Copied from CRM #[id number]. Topic: Clinical - Red Word Triage >> Nov 21, 2023  8:42 AM Darshell M wrote: Red Word that prompted transfer to Nurse Triage: Blurry Vision, fatigue and recurring yeast infections. Family history of diabetes. Reason for Disposition  [1] Fatigue (i.e., tires easily, decreased energy) AND [2] persists > 1 week  Answer Assessment - Initial Assessment Questions 1. DESCRIPTION: Describe how you are feeling.     Fatigued and weak  2. SEVERITY: How bad is it?  Can you stand and walk?     Moderate  3. ONSET: When did these symptoms begin? (e.g., hours, days, weeks, months)     X several months 4. CAUSE: What do you think is causing the weakness or fatigue? (e.g., not drinking enough fluids, medical problem, trouble sleeping)     Possible diabetes  5. NEW MEDICINES:  Have you started on any new medicines recently? (e.g., opioid pain medicines, benzodiazepines, muscle relaxants, antidepressants, antihistamines, neuroleptics, beta blockers)     no 6. OTHER SYMPTOMS: Do you have any other symptoms? (e.g., chest pain, fever, cough, SOB, vomiting, diarrhea, bleeding, other areas of pain)     Blurry vision, fatigue, recurring yeast infection 7. PREGNANCY: Is there any chance you are pregnant? When was your last menstrual period?     No  Pt is requesting to be checked for diabetes due s/s and family history.  Today patient is not actively having s/s.  Protocols used: Weakness (Generalized) and Fatigue-A-AH

## 2023-11-21 NOTE — Telephone Encounter (Signed)
 Appointment scheduled.

## 2023-11-22 ENCOUNTER — Encounter: Payer: Self-pay | Admitting: Family Medicine

## 2023-11-22 ENCOUNTER — Ambulatory Visit: Admitting: Family Medicine

## 2023-11-22 VITALS — BP 104/75 | HR 80 | Ht 61.0 in | Wt 206.0 lb

## 2023-11-22 DIAGNOSIS — Z79899 Other long term (current) drug therapy: Secondary | ICD-10-CM | POA: Diagnosis not present

## 2023-11-22 DIAGNOSIS — R631 Polydipsia: Secondary | ICD-10-CM | POA: Diagnosis not present

## 2023-11-22 DIAGNOSIS — D509 Iron deficiency anemia, unspecified: Secondary | ICD-10-CM | POA: Diagnosis not present

## 2023-11-22 NOTE — Progress Notes (Signed)
   Subjective:    Patient ID: Karen Suarez, female    DOB: [birth year], 28 y.o.   MRN: [id number]  HPI Patient would like to be checked for diabetes Re-occurring yeast infections, extreme fatigue, fam hx of diabetes Patient has family history of diabetes She has been dealing with weight gain She has also been dealing with fatigue tiredness and feeling rundown She works a full-time job and a full-time parent so she does not have much time to devote towards self-care Denies being depressed Relates a lot of fatigue tiredness feeling rundown Also has some irregular cycle issues She has had some lab testing through her gynecologist including CBC and prolactin levels along with liver function all of those looked good Patient having daytime somnolence fatigue tiredness snores at night does not sleep well wakes up in the morning not feeling refreshed has all the classic symptoms of sleep apnea Review of Systems     Objective:   Physical Exam General-in no acute distress Eyes-no discharge Lungs-respiratory rate normal, CTA CV-no murmurs,RRR Extremities skin warm dry no edema Neuro grossly normal Behavior normal, alert        Assessment & Plan:  1. Polydipsia (Primary) Concerning for the possibility diabetes check lab work await results - Hemoglobin A1c - Basic Metabolic Panel (BMET) - Hepatic function panel  2. Iron deficiency anemia, unspecified iron deficiency anemia type Patient has been having intermittent vaginal bleeding this is packed out by her specialist but she needs screening for iron deficiency anemia - Fe+TIBC+Fer  3. High risk medication use Labs ordered await results - Basic Metabolic Panel (BMET) - Hepatic function panel  4. Morbid obesity (HCC) Portion control regular physical activity  I do have some concern that she is developing sleep apnea if her lab work does not reveal the causes behind her severe fatigue tiredness then I would recommend referral  for sleep study

## 2023-11-23 ENCOUNTER — Ambulatory Visit: Payer: Self-pay | Admitting: Family Medicine

## 2023-11-23 DIAGNOSIS — R400 Somnolence: Secondary | ICD-10-CM

## 2023-11-23 DIAGNOSIS — R5383 Other fatigue: Secondary | ICD-10-CM

## 2023-11-23 LAB — BASIC METABOLIC PANEL WITH GFR
BUN/Creatinine Ratio: 14 (ref 9–23)
BUN: 10 mg/dL (ref 6–20)
CO2: 23 mmol/L (ref 20–29)
Calcium: 9.7 mg/dL (ref 8.7–10.2)
Chloride: 100 mmol/L (ref 96–106)
Creatinine, Ser: 0.72 mg/dL (ref 0.57–1.00)
Glucose: 85 mg/dL (ref 70–99)
Potassium: 4.5 mmol/L (ref 3.5–5.2)
Sodium: 139 mmol/L (ref 134–144)
eGFR: 115 mL/min/1.73 (ref >59)

## 2023-11-23 LAB — IRON,TIBC AND FERRITIN PANEL
Ferritin: 109 ng/mL (ref 15–150)
Iron Saturation: 15 % (ref 15–55)
Iron: 50 ug/dL (ref 27–159)
Total Iron Binding Capacity: 326 ug/dL (ref 250–450)
UIBC: 276 ug/dL (ref 131–425)

## 2023-11-23 LAB — HEPATIC FUNCTION PANEL
ALT: 16 IU/L (ref 0–32)
AST: 14 IU/L (ref 0–40)
Albumin: 4.6 g/dL (ref 4.0–5.0)
Alkaline Phosphatase: 131 IU/L — ABNORMAL HIGH (ref 41–116)
Bilirubin Total: 0.5 mg/dL (ref 0.0–1.2)
Bilirubin, Direct: 0.13 mg/dL (ref 0.00–0.40)
Total Protein: 7.1 g/dL (ref 6.0–8.5)

## 2023-11-23 LAB — HEMOGLOBIN A1C
Est. average glucose Bld gHb Est-mCnc: 114 mg/dL
Hgb A1c MFr Bld: 5.6 % (ref 4.8–5.6)

## 2023-11-25 NOTE — Telephone Encounter (Signed)
 Nurses Go ahead with referral to [facility] Diagnosis sleep apnea They can do the initial consult but after that they can set up by a home sleep study (Obviously they will work with her insurance to get this approved)  Please relay this message as well to Narelle so she is aware that they can do the sleep study at [employer] approval

## 2023-12-23 ENCOUNTER — Encounter (HOSPITAL_COMMUNITY): Payer: Self-pay

## 2023-12-23 ENCOUNTER — Telehealth: Admitting: Emergency Medicine

## 2023-12-23 ENCOUNTER — Emergency Department (HOSPITAL_COMMUNITY)

## 2023-12-23 ENCOUNTER — Emergency Department (HOSPITAL_COMMUNITY)
Admission: EM | Admit: 2023-12-23 | Discharge: 2023-12-23 | Disposition: A | Discharge status: Home / Self Care | Source: Ambulatory Visit | Attending: Emergency Medicine | Admitting: Emergency Medicine

## 2023-12-23 ENCOUNTER — Ambulatory Visit: Payer: Self-pay

## 2023-12-23 ENCOUNTER — Other Ambulatory Visit: Payer: Self-pay

## 2023-12-23 DIAGNOSIS — R112 Nausea with vomiting, unspecified: Secondary | ICD-10-CM | POA: Diagnosis present

## 2023-12-23 DIAGNOSIS — R1011 Right upper quadrant pain: Secondary | ICD-10-CM | POA: Diagnosis not present

## 2023-12-23 DIAGNOSIS — R197 Diarrhea, unspecified: Secondary | ICD-10-CM | POA: Insufficient documentation

## 2023-12-23 DIAGNOSIS — R1013 Epigastric pain: Secondary | ICD-10-CM | POA: Insufficient documentation

## 2023-12-23 LAB — URINALYSIS, ROUTINE W REFLEX MICROSCOPIC
Bilirubin Urine: NEGATIVE
Glucose, UA: NEGATIVE mg/dL
Hgb urine dipstick: NEGATIVE
Ketones, ur: NEGATIVE mg/dL
Leukocytes,Ua: NEGATIVE
Nitrite: NEGATIVE
Protein, ur: NEGATIVE mg/dL
Specific Gravity, Urine: 1.015 (ref 1.005–1.030)
pH: 7 (ref 5.0–8.0)

## 2023-12-23 LAB — CBC WITH DIFFERENTIAL/PLATELET
Abs Immature Granulocytes: 0.11 K/uL — ABNORMAL HIGH (ref 0.00–0.07)
Basophils Absolute: 0 K/uL (ref 0.0–0.1)
Basophils Relative: 0 %
Eosinophils Absolute: 0 K/uL (ref 0.0–0.5)
Eosinophils Relative: 0 %
HCT: 43.6 % (ref 36.0–46.0)
Hemoglobin: 14.4 g/dL (ref 12.0–15.0)
Immature Granulocytes: 1 %
Lymphocytes Relative: 8 %
Lymphs Abs: 0.8 K/uL (ref 0.7–4.0)
MCH: 27.3 pg (ref 26.0–34.0)
MCHC: 33 g/dL (ref 30.0–36.0)
MCV: 82.7 fL (ref 80.0–100.0)
Monocytes Absolute: 0.3 K/uL (ref 0.1–1.0)
Monocytes Relative: 3 %
Neutro Abs: 8.2 K/uL — ABNORMAL HIGH (ref 1.7–7.7)
Neutrophils Relative %: 88 %
Platelets: 299 K/uL (ref 150–400)
RBC: 5.27 MIL/uL — ABNORMAL HIGH (ref 3.87–5.11)
RDW: 12.7 % (ref 11.5–15.5)
WBC: 9.4 K/uL (ref 4.0–10.5)
nRBC: 0 % (ref 0.0–0.2)

## 2023-12-23 LAB — COMPREHENSIVE METABOLIC PANEL WITH GFR
ALT: 15 U/L (ref 0–44)
AST: 19 U/L (ref 15–41)
Albumin: 4.6 g/dL (ref 3.5–5.0)
Alkaline Phosphatase: 122 U/L (ref 38–126)
Anion gap: 15 (ref 5–15)
BUN: 9 mg/dL (ref 6–20)
CO2: 25 mmol/L (ref 22–32)
Calcium: 9.1 mg/dL (ref 8.9–10.3)
Chloride: 98 mmol/L (ref 98–111)
Creatinine, Ser: 0.68 mg/dL (ref 0.44–1.00)
GFR, Estimated: >60 mL/min (ref >60)
Glucose, Bld: 104 mg/dL — ABNORMAL HIGH (ref 70–99)
Potassium: 3.7 mmol/L (ref 3.5–5.1)
Sodium: 138 mmol/L (ref 135–145)
Total Bilirubin: 0.6 mg/dL (ref 0.0–1.2)
Total Protein: 7.6 g/dL (ref 6.5–8.1)

## 2023-12-23 LAB — LIPASE, BLOOD: Lipase: 20 U/L (ref 11–51)

## 2023-12-23 LAB — RESP PANEL BY RT-PCR (RSV, FLU A&B, COVID)  RVPGX2
Influenza A by PCR: NEGATIVE
Influenza B by PCR: NEGATIVE
Resp Syncytial Virus by PCR: NEGATIVE
SARS Coronavirus 2 by RT PCR: NEGATIVE

## 2023-12-23 LAB — PREGNANCY, URINE: Preg Test, Ur: NEGATIVE

## 2023-12-23 MED ORDER — IOHEXOL 300 MG/ML  SOLN
100.0000 mL | Freq: Once | INTRAMUSCULAR | Status: AC | PRN
  Administered 2023-12-23: 100 mL via INTRAVENOUS

## 2023-12-23 MED ORDER — KETOROLAC TROMETHAMINE 15 MG/ML IJ SOLN
15.0000 mg | Freq: Once | INTRAMUSCULAR | Status: AC
  Administered 2023-12-23: 15 mg via INTRAVENOUS
  Filled 2023-12-23: qty 1

## 2023-12-23 MED ORDER — ONDANSETRON 4 MG PO TBDP: 4.0000 mg | ORAL_TABLET | Freq: Three times a day (TID) | ORAL | 0 refills | Status: AC | PRN

## 2023-12-23 MED ORDER — ONDANSETRON 4 MG PO TBDP
4.0000 mg | ORAL_TABLET | Freq: Once | ORAL | Status: AC
  Administered 2023-12-23: 4 mg via ORAL
  Filled 2023-12-23: qty 1

## 2023-12-23 MED ORDER — SODIUM CHLORIDE 0.9 % IV BOLUS
1000.0000 mL | Freq: Once | INTRAVENOUS | Status: AC
Start: 2023-12-23 — End: 2023-12-23
  Administered 2023-12-23: 1000 mL via INTRAVENOUS

## 2023-12-23 MED ORDER — SODIUM CHLORIDE 0.9 % IV BOLUS
1000.0000 mL | Freq: Once | INTRAVENOUS | Status: AC
  Administered 2023-12-23: 1000 mL via INTRAVENOUS

## 2023-12-23 MED ORDER — ONDANSETRON HCL 4 MG/2ML IJ SOLN
4.0000 mg | Freq: Once | INTRAMUSCULAR | Status: AC
  Administered 2023-12-23: 4 mg via INTRAVENOUS
  Filled 2023-12-23: qty 2

## 2023-12-23 MED ORDER — MORPHINE SULFATE (PF) 4 MG/ML IV SOLN
4.0000 mg | Freq: Once | INTRAVENOUS | Status: DC
  Filled 2023-12-23: qty 1

## 2023-12-23 MED ORDER — DICYCLOMINE HCL 20 MG PO TABS
20.0000 mg | ORAL_TABLET | Freq: Two times a day (BID) | ORAL | 0 refills | Status: AC
Start: 2023-12-23 — End: ?

## 2023-12-23 NOTE — Patient Instructions (Signed)
  [name], thank you for joining [name], NP for today's virtual visit.  While this provider is not your primary care provider (PCP), if your PCP is located in our provider database this encounter information will be shared with them immediately following your visit.   A [name] MyChart account gives you access to today's visit and all your visits, tests, and labs performed at [facility]  click here if you don't have a [facility] MyChart account or go to [email or web address]  Consent: (Patient) Karen Suarez provided verbal consent for this virtual visit at the beginning of the encounter.  Current Medications:  Current Outpatient Medications:    clonazePAM  (KLONOPIN ) 0.5 MG tablet, Take 1/2-1 tab po BID prn severe anxiety, Disp: 20 tablet, Rfl: 0   fluticasone  (FLONASE ) 50 MCG/ACT nasal spray, SPRAY 2 SPRAYS INTO EACH NOSTRIL EVERY DAY (Patient taking differently: Place 2 sprays into both nostrils daily as needed for allergies.), Disp: 48 mL, Rfl: 2   sertraline  (ZOLOFT ) 100 MG tablet, Take 1 1/2 tabs (150 mg) total po every day, Disp: 135 tablet, Rfl: 1   Medications ordered in this encounter:  No orders of the defined types were placed in this encounter.    *If you need refills on other medications prior to your next appointment, please contact your pharmacy*  Follow-Up: Call back or seek an in-person evaluation if the symptoms worsen or if the condition fails to improve as anticipated.  [facility] [phone]  Other Instructions  I recommend you get checked in an ER for your abd pain. Your symptoms are not consistent with  a GI virus and I am concerned about you.    If you have been instructed to have an in-person evaluation today at a local Urgent Care facility, please use the link below. It will take you to a list of all of our available [facility], including address, phone number and hours of operation.  Please do not delay care.  [facility]  If you or a family member do not have a primary care provider, use the link below to schedule a visit and establish care. When you choose a [facility] primary care physician or advanced practice provider, you gain a long-term partner in health. Find a Primary Care Provider  Learn more about [facility]'s in-office and virtual care options: [facility] - Get Care Now

## 2023-12-23 NOTE — Telephone Encounter (Signed)
 FYI Only or Action Required?: Action required by provider: request for appointment.  Patient was last seen in primary care on 11/22/2023 by Karen Glendia LABOR, MD.  Called Nurse Triage reporting Vomiting.  Symptoms began today.  Interventions attempted: Nothing.  Symptoms are: unchanged. Pt. Started having vomiting, diarrhea 0300 this morning. Sharp abdominal pains.  Triage Disposition: See Physician Within 24 Hours  Patient/caregiver understands and will follow disposition?:     Copied from CRM #[id number]. Topic: Clinical - Red Word Triage >> Dec 23, 2023 11:25 AM Everette C wrote: [facility] that prompted transfer to Nurse Triage: The patient is calling and shares that they're having continuous diarrhea and vomiting that has began at 3 AM this morning. Answer Assessment - Initial Assessment Questions 1. VOMITING SEVERITY: How many times have you vomited in the past 24 hours?      4 2. ONSET: When did the vomiting begin?      today 3. FLUIDS: What fluids or food have you vomited up today? Have you been able to keep any fluids down?     no 4. ABDOMEN PAIN: Are your having any abdomen pain? If Yes : How bad is it and what does it feel like? (e.g., crampy, dull, intermittent, constant)      Sharp, comes and goes 5. DIARRHEA: Is there any diarrhea? If Yes, ask: How many times today?      2 6. CONTACTS: Is there anyone else in the family with the same symptoms?      no 7. CAUSE: What do you think is causing your vomiting?     unsure 8. HYDRATION STATUS: Any signs of dehydration? (e.g., dry mouth [not only dry lips], too weak to stand) When did you last urinate?     no 9. OTHER SYMPTOMS: Do you have any other symptoms? (e.g., fever, headache, vertigo, vomiting blood or coffee grounds, recent head injury)     diarrhea 10. PREGNANCY: Is there any chance you are pregnant? When was your last menstrual period?       no  Protocols used: Vomiting-A-AH  Reason for  Disposition  [1] MILD or MODERATE vomiting AND [2] present > 48 hours (2 days)  (Exception: Mild vomiting with associated diarrhea.)  Answer Assessment - Initial Assessment Questions 1. VOMITING SEVERITY: How many times have you vomited in the past 24 hours?      4 2. ONSET: When did the vomiting begin?      today 3. FLUIDS: What fluids or food have you vomited up today? Have you been able to keep any fluids down?     no 4. ABDOMEN PAIN: Are your having any abdomen pain? If Yes : How bad is it and what does it feel like? (e.g., crampy, dull, intermittent, constant)      Sharp, comes and goes 5. DIARRHEA: Is there any diarrhea? If Yes, ask: How many times today?      2 6. CONTACTS: Is there anyone else in the family with the same symptoms?      no 7. CAUSE: What do you think is causing your vomiting?     unsure 8. HYDRATION STATUS: Any signs of dehydration? (e.g., dry mouth [not only dry lips], too weak to stand) When did you last urinate?     no 9. OTHER SYMPTOMS: Do you have any other symptoms? (e.g., fever, headache, vertigo, vomiting blood or coffee grounds, recent head injury)     diarrhea 10. PREGNANCY: Is there any chance you are pregnant? When  was your last menstrual period?       no  Protocols used: Vomiting-A-AH

## 2023-12-23 NOTE — Discharge Instructions (Addendum)
 Please follow-up with your primary care doctor on an outpatient basis.  Return to emergency department immediately for any new or worsening symptoms.

## 2023-12-23 NOTE — Progress Notes (Signed)
 Virtual Visit Consent   Karen Suarez, you are scheduled for a virtual visit with a [facility] provider today. Just as with appointments in the office, your consent must be obtained to participate. Your consent will be active for this visit and any virtual visit you may have with one of our providers in the next 365 days. If you have a MyChart account, a copy of this consent can be sent to you electronically.  As this is a virtual visit, video technology does not allow for your provider to perform a traditional examination. This may limit your provider's ability to fully assess your condition. If your provider identifies any concerns that need to be evaluated in person or the need to arrange testing (such as labs, EKG, etc.), we will make arrangements to do so. Although advances in technology are sophisticated, we cannot ensure that it will always work on either your end or our end. If the connection with a video visit is poor, the visit may have to be switched to a telephone visit. With either a video or telephone visit, we are not always able to ensure that we have a secure connection.  By engaging in this virtual visit, you consent to the provision of healthcare and authorize for your insurance to be billed (if applicable) for the services provided during this visit. Depending on your insurance coverage, you may receive a charge related to this service.  I need to obtain your verbal consent now. Are you willing to proceed with your visit today? Karen Suarez has provided verbal consent on 12/23/2023 for a virtual visit (video or telephone). [name], NP  Date: 12/23/2023 2:30 PM   Virtual Visit via Video Note   I, [name], connected with  Karen Suarez  ([id number], 03/17/1995) on 12/23/23 at  2:30 PM EST by a video-enabled telemedicine application and verified that I am speaking with the correct person using two identifiers.  Location: Patient: Virtual Visit Location  Patient: Home Provider: Virtual Visit Location Provider: Home Office   I discussed the limitations of evaluation and management by telemedicine and the availability of in person appointments. The patient expressed understanding and agreed to proceed.    History of Present Illness: Karen Suarez is a 28 y.o. who identifies as a female who was assigned female at birth, and is being seen today for vomiting and diarrhea.   Woke up at 245am with nausea, took tums. Vomiting at 4am. Vomited ~4 times. Last vomited ~10am. Chills, unable to take temp.   Not keeping liquids down. Gatorade and ginger ale. Small sips of water .   Diarrhea x2. No blood or mucus. Liquid diarrhea. Has been having very loose diarrhea for a couple of weeks since had endometrial biopsy. Today's is worse, more liquidy.   Abd pain constant. Goes to her back. In epigastric area. Does have some pain come and go from suprapubic area from prior biopsy  Hx c-section, no other abd surgery.   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   Acute rhinosinusitis 03/29/2020   Postpartum care following cesarean delivery 6/9 07/31/2019   Large for dates affecting management  of mother, third trimester, not applicable or unspecified fetus 07/30/2019   S/P cesarean section 07/29/2019    Allergies: No Known Allergies Medications:  Current Outpatient Medications:    clonazePAM  (KLONOPIN ) 0.5 MG tablet, Take 1/2-1 tab po BID prn severe anxiety, Disp: 20 tablet, Rfl: 0   fluticasone  (FLONASE ) 50 MCG/ACT nasal spray, SPRAY 2 SPRAYS INTO EACH NOSTRIL EVERY DAY (Patient taking  differently: Place 2 sprays into both nostrils daily as needed for allergies.), Disp: 48 mL, Rfl: 2   sertraline  (ZOLOFT ) 100 MG tablet, Take 1 1/2 tabs (150 mg) total po every day, Disp: 135 tablet, Rfl: 1  Observations/Objective: Patient is well-developed, well-nourished in no acute distress.  Resting comfortably  at home.  Head is normocephalic, atraumatic.  No labored breathing.  Speech is clear and coherent with logical content.  Patient is alert and oriented at baseline.    Assessment and Plan: 1. Epigastric pain (Primary)  Abd pain that goes through to back. Is constant. Needs ED care.   Follow Up Instructions: I discussed the assessment and treatment plan with the patient. The patient was provided an opportunity to ask questions and all were answered. The patient agreed with the plan and demonstrated an understanding of the instructions.  A copy of instructions were sent to the patient via MyChart unless otherwise noted below.   The patient was advised to call back or seek an in-person evaluation if the symptoms worsen or if the condition fails to improve as anticipated.    [name], NP

## 2023-12-23 NOTE — ED Triage Notes (Signed)
 Pt arrived via POV following a TeleMedicine Appointment advising her to seek further evaluation in the ER for N/V/D that began this morning. Pt reports she is unable to keep anything down and endorses epigastric abdominal cramping.

## 2023-12-23 NOTE — ED Provider Notes (Signed)
 [place] [facility]GENC[facility] DEPARTMENT AT [facility] Provider Note   CSN: [id number] Arrival date & time: 12/23/23  1539     Patient presents with: Emesis   Karen Suarez is a 28 y.o. female.   Patient is a 28 year old female who presents emergency department the chief complaint of nausea, vomiting, diarrhea, epigastric and right upper quadrant abdominal pain which has been ongoing since early this morning.  Patient notes that she does have a history of C-section with no other abdominal surgeries.  She has had no dysuria or hematuria.  She admits to associated chills without true fever at home.  She denies any chest pain or shortness of breath.  She denies any known sick contacts.   Emesis Associated symptoms: abdominal pain and diarrhea        Prior to Admission medications   Medication Sig Start Date End Date Taking? Authorizing Provider  clonazePAM  (KLONOPIN ) 0.5 MG tablet Take 1/2-1 tab po BID prn severe anxiety 09/20/23   Hoskins, Carolyn C, NP  fluticasone  (FLONASE ) 50 MCG/ACT nasal spray SPRAY 2 SPRAYS INTO EACH NOSTRIL EVERY DAY Patient taking differently: Place 2 sprays into both nostrils daily as needed for allergies. 03/09/20   Alphonsa Glendia LABOR, MD  sertraline  (ZOLOFT ) 100 MG tablet Take 1 1/2 tabs (150 mg) total po every day 09/20/23   Hoskins, Carolyn C, NP    Allergies: Patient has no known allergies.    Review of Systems  Gastrointestinal:  Positive for abdominal pain, diarrhea, nausea and vomiting.  All other systems reviewed and are negative.   Updated Vital Signs BP 118/85 (BP Location: Right Arm)   Pulse (!) 135   Temp 98.3 F (36.8 C) (Temporal)   Resp 19   Ht 5' 1 (1.549 m)   Wt 93.4 kg   SpO2 97%   BMI 38.91 kg/m   Physical Exam Vitals and nursing note reviewed.  Constitutional:      General: She is not in acute distress.    Appearance: Normal appearance. She is not ill-appearing.  HENT:     Head: Normocephalic and atraumatic.     Nose:  Nose normal.     Mouth/Throat:     Mouth: Mucous membranes are moist.  Eyes:     Extraocular Movements: Extraocular movements intact.     Conjunctiva/sclera: Conjunctivae normal.     Pupils: Pupils are equal, round, and reactive to light.  Cardiovascular:     Rate and Rhythm: Normal rate and regular rhythm.     Pulses: Normal pulses.     Heart sounds: Normal heart sounds. No murmur heard.    No gallop.  Pulmonary:     Effort: Pulmonary effort is normal. No respiratory distress.     Breath sounds: Normal breath sounds. No stridor. No wheezing, rhonchi or rales.  Abdominal:     General: Abdomen is flat. Bowel sounds are normal. There is no distension.     Palpations: Abdomen is soft.     Tenderness: There is no guarding.     Comments: Tender to palpation to right upper quadrant and epigastric region  Musculoskeletal:        General: Normal range of motion.     Cervical back: Normal range of motion and neck supple.  Skin:    General: Skin is warm and dry.  Neurological:     General: No focal deficit present.     Mental Status: She is alert and oriented to person, place, and time. Mental status is at  baseline.  Psychiatric:        Mood and Affect: Mood normal.        Behavior: Behavior normal.        Thought Content: Thought content normal.        Judgment: Judgment normal.     (all labs ordered are listed, but only abnormal results are displayed) Labs Reviewed  CBC WITH DIFFERENTIAL/PLATELET - Abnormal; Notable for the following components:      Result Value   RBC 5.27 (*)    Neutro Abs 8.2 (*)    Abs Immature Granulocytes 0.11 (*)    All other components within normal limits  COMPREHENSIVE METABOLIC PANEL WITH GFR - Abnormal; Notable for the following components:   Glucose, Bld 104 (*)    All other components within normal limits  RESP PANEL BY RT-PCR (RSV, FLU A&B, COVID)  RVPGX2  LIPASE, BLOOD  URINALYSIS, ROUTINE W REFLEX MICROSCOPIC     EKG: None  Radiology: No results found.   Procedures   Medications Ordered in the ED  morphine  (PF) 4 MG/ML injection 4 mg (has no administration in time range)  ondansetron  (ZOFRAN ) injection 4 mg (has no administration in time range)  ketorolac  (TORADOL ) 15 MG/ML injection 15 mg (has no administration in time range)  sodium chloride  0.9 % bolus 1,000 mL (has no administration in time range)                                    Medical Decision Making Amount and/or Complexity of Data Reviewed Labs: ordered. Radiology: ordered.  Risk Prescription drug management.   This patient presents to the ED for concern of abdominal pain, nausea, vomiting, diarrhea differential diagnosis includes gastroenteritis, acute appendicitis, cholecystitis, small bowel obstruction, diverticulitis, ovarian torsion or cyst, PID, tubo-ovarian abscess, pyelonephritis, kidney stone, pancreatitis, mesenteric ischemia    Additional history obtained:  Additional history obtained from none External records from outside source obtained and reviewed including none   Lab Tests:  I Ordered, and personally interpreted labs.  The pertinent results include: No leukocytosis, no anemia, normal kidney function liver function, normal electrolytes, unremarkable urinalysis, negative viral swab, normal lipase   Imaging Studies ordered:  I ordered imaging studies including CT scan of the abdomen and pelvis I independently visualized and interpreted imaging which showed no acute surgical process within the abdomen and pelvis, diverticulosis without diverticulitis I agree with the radiologist interpretation   Medicines ordered and prescription drug management:  I ordered medication including IV fluids, Zofran , Toradol  for abdominal pain, nausea, vomiting Reevaluation of the patient after these medicines showed that the patient improved I have reviewed the patients home medicines and have made adjustments  as needed   Problem List / ED Course:  Patient is doing much better at this time and is stable for discharge home.  Heart rate has greatly improved with IV fluids in the emergency department do suspect dehydration.  Suspect that she has a viral gastroenteritis at this point.  CT scan of abdomen and pelvis was unremarked for any signs of acute intra-abdominal surgical process.  Blood work was unremarkable as well.  Will continue symptomatic treatment on outpatient basis.  Close follow-up with PCP was discussed as well as strict turn precautions for any new or worsening symptoms.  Patient voiced understanding and had no additional questions.   Social Determinants of Health:  None        Final  diagnoses:  None    ED Discharge Orders     None          Daralene Lonni JONETTA DEVONNA 12/23/23 2159    Melvenia Motto, MD 12/23/23 2249

## 2023-12-23 NOTE — ED Notes (Signed)
 Discharge instructions reviewed.   Newly prescribed medications discussed. Pharmacy verified.   Opportunity for questions and concerns provided.   Alert, oriented and ambulatory.   Displays no signs of distress.

## 2023-12-27 ENCOUNTER — Ambulatory Visit: Admitting: Nurse Practitioner

## 2024-03-04 ENCOUNTER — Telehealth: Payer: Self-pay | Admitting: Neurology

## 2024-03-04 ENCOUNTER — Institutional Professional Consult (permissible substitution): Admitting: Neurology

## 2024-03-04 NOTE — Telephone Encounter (Signed)
 Provider called out sick Mychart

## 2024-03-25 ENCOUNTER — Institutional Professional Consult (permissible substitution): Admitting: Neurology

## 2024-05-13 ENCOUNTER — Institutional Professional Consult (permissible substitution): Admitting: Neurology
